# Patient Record
Sex: Female | Born: 1985 | Race: Black or African American | Hispanic: No | Marital: Single | Smoking: Former smoker
Health system: Southern US, Community
[De-identification: ages and names within clinical notes are randomized; demographics above are authoritative.]

## PROBLEM LIST (undated history)

## (undated) DIAGNOSIS — G894 Chronic pain syndrome: Secondary | ICD-10-CM

## (undated) DIAGNOSIS — M797 Fibromyalgia: Secondary | ICD-10-CM

## (undated) DIAGNOSIS — J45909 Unspecified asthma, uncomplicated: Secondary | ICD-10-CM

## (undated) DIAGNOSIS — K219 Gastro-esophageal reflux disease without esophagitis: Secondary | ICD-10-CM

## (undated) DIAGNOSIS — N939 Abnormal uterine and vaginal bleeding, unspecified: Secondary | ICD-10-CM

## (undated) DIAGNOSIS — F329 Major depressive disorder, single episode, unspecified: Secondary | ICD-10-CM

## (undated) DIAGNOSIS — F419 Anxiety disorder, unspecified: Secondary | ICD-10-CM

## (undated) DIAGNOSIS — Z8489 Family history of other specified conditions: Secondary | ICD-10-CM

## (undated) DIAGNOSIS — D649 Anemia, unspecified: Secondary | ICD-10-CM

## (undated) DIAGNOSIS — F32A Depression, unspecified: Secondary | ICD-10-CM

## (undated) DIAGNOSIS — M1990 Unspecified osteoarthritis, unspecified site: Secondary | ICD-10-CM

## (undated) HISTORY — DX: Chronic pain syndrome: G89.4

## (undated) HISTORY — DX: Fibromyalgia: M79.7

## (undated) HISTORY — PX: HERNIA REPAIR: SHX51

## (undated) HISTORY — DX: Abnormal uterine and vaginal bleeding, unspecified: N93.9

---
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
## 2015-10-11 ENCOUNTER — Emergency Department
Admission: EM | Admit: 2015-10-11 | Discharge: 2015-10-11 | Disposition: A | Discharge status: Home / Self Care | Payer: Medicaid Other | Attending: Emergency Medicine | Admitting: Emergency Medicine

## 2015-10-11 DIAGNOSIS — J029 Acute pharyngitis, unspecified: Secondary | ICD-10-CM | POA: Diagnosis not present

## 2015-10-11 DIAGNOSIS — B349 Viral infection, unspecified: Secondary | ICD-10-CM | POA: Insufficient documentation

## 2015-10-11 DIAGNOSIS — Z880 Allergy status to penicillin: Secondary | ICD-10-CM | POA: Diagnosis not present

## 2015-10-11 DIAGNOSIS — R509 Fever, unspecified: Secondary | ICD-10-CM | POA: Diagnosis present

## 2015-10-11 LAB — POCT RAPID STREP A: STREPTOCOCCUS, GROUP A SCREEN (DIRECT): NEGATIVE

## 2015-10-11 MED ORDER — LIDOCAINE VISCOUS 2 % MT SOLN: 5.0000 mL | Freq: Four times a day (QID) | OROMUCOSAL | Status: DC | PRN

## 2015-10-11 MED ORDER — PSEUDOEPH-BROMPHEN-DM 30-2-10 MG/5ML PO SYRP: 5.0000 mL | ORAL_SOLUTION | Freq: Four times a day (QID) | ORAL | Status: DC | PRN

## 2015-10-11 MED ORDER — DIPHENHYDRAMINE HCL 12.5 MG/5ML PO ELIX
25.0000 mg | ORAL_SOLUTION | Freq: Once | ORAL | Status: AC
  Administered 2015-10-11: 25 mg via ORAL
  Filled 2015-10-11: qty 10

## 2015-10-11 MED ORDER — OSELTAMIVIR PHOSPHATE 75 MG PO CAPS: 75.0000 mg | ORAL_CAPSULE | Freq: Two times a day (BID) | ORAL | Status: AC

## 2015-10-11 MED ORDER — LIDOCAINE VISCOUS 2 % MT SOLN
15.0000 mL | Freq: Once | OROMUCOSAL | Status: AC
  Administered 2015-10-11: 15 mL via OROMUCOSAL
  Filled 2015-10-11: qty 15

## 2015-10-11 MED ORDER — KETOROLAC TROMETHAMINE 60 MG/2ML IM SOLN
30.0000 mg | Freq: Once | INTRAMUSCULAR | Status: AC
  Administered 2015-10-11: 30 mg via INTRAMUSCULAR
  Filled 2015-10-11: qty 2

## 2015-10-11 NOTE — ED Notes (Signed)
Pt reports 3 days of fever, body aches, headache.

## 2015-10-11 NOTE — ED Provider Notes (Signed)
[facility] Emergency Department Provider Note  ____________________________________________  Time seen: Approximately 9:57 AM  I have reviewed the triage vital signs and the nursing notes.   HISTORY  Chief Complaint Fever and Generalized Body Aches    HPI Beth Floyd is a 30 y.o. female patient complaining of fever bodyaches sore throat which began 3 days ago. Patient state increased pain with swallowing.No palliative measures taken for this complaint. Patient has not taken a flu shot this season. Patient rates the pain discomfort as 8/10.   No past medical history on file.  There are no active problems to display for this patient.   No past surgical history on file.  Current Outpatient Rx  Name  Route  Sig  Dispense  Refill  . brompheniramine-pseudoephedrine-DM 30-2-10 MG/5ML syrup   Oral   Take 5 mLs by mouth 4 (four) times daily as needed. Mixed with 5 mL of viscous lidocaine for swish and swallow.   120 mL   0   . lidocaine (XYLOCAINE) 2 % solution   Mouth/Throat   Use as directed 5 mLs in the mouth or throat every 6 (six) hours as needed for mouth pain. Mixed with 5 mL of Bromfed DM. Swish and swallow.   100 mL   0   . oseltamivir (TAMIFLU) 75 MG capsule   Oral   Take 1 capsule (75 mg total) by mouth 2 (two) times daily.   20 capsule   0     Allergies Penicillins  No family history on file.  Social History Social History  Substance Use Topics  . Smoking status: Not on file  . Smokeless tobacco: Not on file  . Alcohol Use: Not on file    Review of Systems Constitutional: Fever and chills  Eyes: No visual changes. ENT: Sore throat Cardiovascular: Denies chest pain. Respiratory: Denies shortness of breath. Gastrointestinal: No abdominal pain.  No nausea, no vomiting.  No diarrhea.  No constipation. Genitourinary: Negative for dysuria. Musculoskeletal: Negative for back pain. Skin: Negative for rash. Neurological:  Negative for headaches, focal weakness or numbness. Allergic/Immunilogical: Penicillin  10-point ROS otherwise negative.  ____________________________________________   PHYSICAL EXAM:  VITAL SIGNS: ED Triage Vitals  Enc Vitals Group     BP 10/11/15 0824 119/74 mmHg     Pulse Rate 10/11/15 0824 90     Resp 10/11/15 0824 18     Temp 10/11/15 0824 98.3 F (36.8 C)     Temp Source 10/11/15 0824 Oral     SpO2 10/11/15 0824 100 %     Weight 10/11/15 0824 242 lb (109.77 kg)     Height 10/11/15 0824  (1.6 m)     Head Cir --      Peak Flow --      Pain Score 10/11/15 0825 8     Pain Loc --      Pain Edu? --      Excl. in GC? --     Constitutional: Alert and oriented. Appears malaise  Eyes: Conjunctivae are normal. PERRL. EOMI. Head: Atraumatic. Nose: No congestion/rhinnorhea. Mouth/Throat: Mucous membranes are moist.  Oropharynx erythematous. Edematous tonsils without exudate. Neck: No stridor. No cervical spine tenderness to palpation. Hematological/Lymphatic/Immunilogical: No cervical lymphadenopathy. Cardiovascular: Normal rate, regular rhythm. Grossly normal heart sounds.  Good peripheral circulation. Respiratory: Normal respiratory effort.  No retractions. Lungs CTAB. No productive cough Gastrointestinal: Soft and nontender. No distention. No abdominal bruits. No CVA tenderness. Musculoskeletal: No lower extremity tenderness nor edema.  No  joint effusions. Neurologic:  Normal speech and language. No gross focal neurologic deficits are appreciated. No gait instability. Skin:  Skin is warm, dry and intact. No rash noted. Psychiatric: Mood and affect are normal. Speech and behavior are normal.  ____________________________________________   LABS (all labs ordered are listed, but only abnormal results are displayed)  Labs Reviewed  CULTURE, GROUP A STREP [facility]  POCT RAPID STREP A    ____________________________________________  EKG   ____________________________________________  RADIOLOGY   ____________________________________________   PROCEDURES  Procedure(s) performed: None  Critical Care performed: No  ____________________________________________   INITIAL IMPRESSION / ASSESSMENT AND PLAN / ED COURSE  Pertinent labs & imaging results that were available during my care of the patient were reviewed by me and considered in my medical decision making (see chart for details).  Viral pharyngitis. Discussed negative rapid strep with patient advised cultures pending. Patient given discharge care instructions. Patient a prescription for Bromfed DM, viscous lidocaine, and Medrol Dosepak. Advised to follow-up with family doctor no improvement in 3-5 days. ____________________________________________   FINAL CLINICAL IMPRESSION(S) / ED DIAGNOSES  Final diagnoses:  Pharyngitis with viral syndrome      Joni ReiningRonald K Smith, PA-C 10/11/15 1012  Rockne MenghiniAnne-Caroline Norman, MD 10/11/15 1535

## 2015-10-11 NOTE — ED Notes (Signed)
Strep completed by tech, Pam 

## 2015-10-11 NOTE — ED Notes (Signed)
States she developed fever last pm of 102  This am body ahces with sore throat   Increased pain with swallowing

## 2015-10-11 NOTE — Discharge Instructions (Signed)

## 2015-10-11 NOTE — ED Notes (Signed)
Pt given warm blanket.

## 2015-10-13 LAB — CULTURE, GROUP A STREP (THRC)

## 2015-10-14 NOTE — Progress Notes (Signed)
30 y/o F with throat culture positive for GAS discharged from ED 10/11/15 without antibiotics. Dr. Pershing ProudSchaevitz authorized a Zpak TAD without RF. Attempted to call patient, but the phone number on file is not in service and no information in chart regarding PCP.   Luisa HartScott Keeton Kassebaum, PharmD Clinical Pharmacist

## 2016-11-21 ENCOUNTER — Emergency Department
Admission: EM | Admit: 2016-11-21 | Discharge: 2016-11-21 | Disposition: A | Discharge status: Home / Self Care | Payer: Medicaid Other | Attending: Emergency Medicine | Admitting: Emergency Medicine

## 2016-11-21 DIAGNOSIS — K029 Dental caries, unspecified: Secondary | ICD-10-CM | POA: Diagnosis not present

## 2016-11-21 DIAGNOSIS — K0889 Other specified disorders of teeth and supporting structures: Secondary | ICD-10-CM | POA: Diagnosis present

## 2016-11-21 MED ORDER — HYDROCODONE-ACETAMINOPHEN 5-325 MG PO TABS: 1.0000 | ORAL_TABLET | Freq: Four times a day (QID) | ORAL | 0 refills | Status: AC | PRN

## 2016-11-21 MED ORDER — CLINDAMYCIN HCL 300 MG PO CAPS: 300.0000 mg | ORAL_CAPSULE | Freq: Three times a day (TID) | ORAL | 0 refills | Status: AC

## 2016-11-21 MED ORDER — KETOROLAC TROMETHAMINE 60 MG/2ML IM SOLN
30.0000 mg | Freq: Once | INTRAMUSCULAR | Status: AC
  Administered 2016-11-21: 30 mg via INTRAMUSCULAR

## 2016-11-21 MED ORDER — KETOROLAC TROMETHAMINE 30 MG/ML IJ SOLN
INTRAMUSCULAR | Status: AC
  Filled 2016-11-21: qty 1

## 2016-11-21 NOTE — ED Triage Notes (Signed)
Patient reports dental pain since yesterday.  States started left lower but now feels like her whole mouth.

## 2016-11-21 NOTE — ED Provider Notes (Signed)
[facility] Emergency Department Provider Note  ____________________________________________  Time seen: Approximately 8:52 PM  I have reviewed the triage vital signs and the nursing notes.   HISTORY  Chief Complaint Dental Pain    HPI Beth Floyd is a 31 y.o. female that presents the emergency department with low right dental pain for one day. Patient has not seen a dentist in 5 months. Dentist pulled a lower left tooth but did not pull her lower right tooth. Last time she was here, she received a shot in her hip, which helped. She is allergic to penicillins. She denies fever, difficulty opening and closing mouth, drooling, drainage from mouth, swelling, shortness of breath, chest pain, nausea, vomiting, abdominal pain.   No past medical history on file.  There are no active problems to display for this patient.   No past surgical history on file.  Prior to Admission medications   Medication Sig Start Date End Date Taking? Authorizing Provider  brompheniramine-pseudoephedrine-DM 30-2-10 MG/5ML syrup Take 5 mLs by mouth 4 (four) times daily as needed. Mixed with 5 mL of viscous lidocaine for swish and swallow. 10/11/15   Joni Reining, PA-C  clindamycin (CLEOCIN) 300 MG capsule Take 1 capsule (300 mg total) by mouth 3 (three) times daily. 11/21/16 12/01/16  Enid Derry, PA-C  HYDROcodone-acetaminophen (NORCO/VICODIN) 5-325 MG tablet Take 1 tablet by mouth every 6 (six) hours as needed for moderate pain. 11/21/16 11/23/16  Enid Derry, PA-C  lidocaine (XYLOCAINE) 2 % solution Use as directed 5 mLs in the mouth or throat every 6 (six) hours as needed for mouth pain. Mixed with 5 mL of Bromfed DM. Swish and swallow. 10/11/15   Joni Reining, PA-C    Allergies Penicillins  No family history on file.  Social History Social History  Substance Use Topics  . Smoking status: Not on file  . Smokeless tobacco: Not on file  . Alcohol use Not on file      Review of Systems  Constitutional: No fever/chills ENT: No upper respiratory complaints. Cardiovascular: No chest pain. Respiratory: No cough. No SOB. Gastrointestinal: No abdominal pain.  No nausea, no vomiting.  Musculoskeletal: Negative for musculoskeletal pain. Skin: Negative for rash, abrasions, lacerations, ecchymosis. Neurological: Negative for headaches, numbness or tingling   ____________________________________________   PHYSICAL EXAM:  VITAL SIGNS: ED Triage Vitals  Enc Vitals Group     BP 11/21/16 1940 (!) 149/97     Pulse Rate 11/21/16 1940 69     Resp 11/21/16 1940 18     Temp 11/21/16 1940 99.3 F (37.4 C)     Temp Source 11/21/16 1940 Oral     SpO2 11/21/16 1940 97 %     Weight 11/21/16 1941 220 lb (99.8 kg)     Height 11/21/16 1941  (1.6 m)     Head Circumference --      Peak Flow --      Pain Score 11/21/16 1939 10     Pain Loc --      Pain Edu? --      Excl. in GC? --      Constitutional: Alert and oriented. Well appearing and in no acute distress. Eyes: Conjunctivae are normal. PERRL. EOMI. Head: Atraumatic. ENT:      Ears:      Nose: No congestion/rhinnorhea.      Mouth/Throat: Mucous membranes are moist. Multiple dental caries. Poor dentition. Tenderness to palpation over lower right molars. No drainage present. No TMJ pain. No  swelling. Neck: No stridor. Cardiovascular: Normal rate, regular rhythm.  Good peripheral circulation. Respiratory: Normal respiratory effort without tachypnea or retractions. Lungs CTAB. Good air entry to the bases with no decreased or absent breath sounds. Musculoskeletal: Full range of motion to all extremities. No gross deformities appreciated. Neurologic:  Normal speech and language. No gross focal neurologic deficits are appreciated.  Skin:  Skin is warm, dry and intact. No rash noted.   ____________________________________________   LABS (all labs ordered are listed, but only abnormal results are  displayed)  Labs Reviewed - No data to display ____________________________________________  EKG   ____________________________________________  RADIOLOGY  No results found.  ____________________________________________    PROCEDURES  Procedure(s) performed:    Procedures    Medications  ketorolac (TORADOL) injection 30 mg (30 mg Intramuscular Given 11/21/16 2146)     ____________________________________________   INITIAL IMPRESSION / ASSESSMENT AND PLAN / ED COURSE  Pertinent labs & imaging results that were available during my care of the patient were reviewed by me and considered in my medical decision making (see chart for details).  Review of the [place] CSRS was performed in accordance of the [facility] prior to dispensing any controlled drugs.     Patient's diagnosis is consistent with dental caries. Vital signs and exam are reassuring. Patient will be discharged home with prescriptions for clindamycin and a short course of Vicodin. Patient is to follow up with dentist as directed. Patient is given ED precautions to return to the ED for any worsening or new symptoms.     ____________________________________________  FINAL CLINICAL IMPRESSION(S) / ED DIAGNOSES  Final diagnoses:  Dental caries      NEW MEDICATIONS STARTED DURING THIS VISIT:  Discharge Medication List as of 11/21/2016  9:32 PM    START taking these medications   Details  clindamycin (CLEOCIN) 300 MG capsule Take 1 capsule (300 mg total) by mouth 3 (three) times daily., Starting Tue 11/21/2016, Until Fri 12/01/2016, Print    HYDROcodone-acetaminophen (NORCO/VICODIN) 5-325 MG tablet Take 1 tablet by mouth every 6 (six) hours as needed for moderate pain., Starting Tue 11/21/2016, Until Thu 11/23/2016, Print            This chart was dictated using voice recognition software/Dragon. Despite best efforts to proofread, errors can occur which can change the meaning. Any change was purely  unintentional.    Enid Derry, PA-C 11/22/16 0017    Phineas Semen, MD 11/22/16 1538

## 2016-11-21 NOTE — Discharge Instructions (Signed)
OPTIONS FOR DENTAL FOLLOW UP CARE ° °[place] Department of Health and Human Services - Local Safety Net Dental Clinics °http://www.ncdhhs.gov/dph/oralhealth/services/safetynetclinics.htm °  °[facility] (336-562-3123) ° °[facility] [place] ([phone]) ° °[facility] ([phone] ext [phone]) ° °[facility] ([phone]) ° °[facility] ([phone]) °This clinic caters to the indigent population and is on a lottery system. °Location: °[facility], [address], [place] °Clinic Hours: °Wednesdays from 6pm - 9pm, patients seen by a lottery system. °For dates, call or go to www.med.[facility].edu/[facility]/patients/Dental-[facility] °Services: °Cleanings, fillings and simple extractions. °Payment Options: °DENTAL WORK IS FREE OF CHARGE. Bring proof of income or support. °Best way to get seen: °Arrive at 5:15 pm - this is a lottery, NOT first come/first serve, so arriving earlier will not increase your chances of being seen. °  °  °[facility] °[phone] °Select option 1 for emergencies °  °Location: °[facility], [address], [place] °Clinic Hours: °No walk-ins accepted - call the day before to schedule an appointment. °Check in times are 9:30 am and 1:30 pm. °Services: °Simple extractions, temporary fillings, pulpectomy/pulp debridement, uncomplicated abscess drainage. °Payment Options: °PAYMENT IS DUE AT THE TIME OF SERVICE.  Fee is usually $100-200, additional surgical procedures (e.g. abscess drainage) may be extra. °Cash, checks, Visa/MasterCard accepted.  Can file Medicaid if patient is covered for dental - patient should call case worker to check. °No discount for [facility] patients. °Best way to get seen: °MUST call the day before and get onto the schedule. Can usually be seen the next 1-2 days. No walk-ins accepted. °  °  °[facility] °[phone] °   °Location: °[facility], [address], [place] °Clinic Hours: °M, W, Th, F 8am or 1:30pm, Tues 9a or 1:30 - first come/first served. °Services: °Simple extractions, temporary fillings, uncomplicated abscess drainage.  You do not need to be an [place] resident. °Payment Options: °PAYMENT IS DUE AT THE TIME OF SERVICE. °Dental insurance, otherwise sliding scale - bring proof of income or support. °Depending on income and treatment needed, cost is usually $50-200. °Best way to get seen: °Arrive early as it is first come/first served. °  °  °[facility] °[phone] °  °Location: °[address] °Clinic Hours: °Mon-Thu 8a-5p °Services: °Most basic dental services including extractions and fillings. °Payment Options: °PAYMENT IS DUE AT THE TIME OF SERVICE. °Sliding scale, up to 50% off - bring proof if income or support. °Medicaid with dental option accepted. °Best way to get seen: °Call to schedule an appointment, can usually be seen within 2 weeks OR they will try to see walk-ins - show up at 8a or 2p (you may have to wait). °  °  °[facility] °919-245-2435 °[place] RESIDENTS ONLY °  °Location: °[facility], [address], [place], [place] [address] °Clinic Hours: By appointment only. °Monday - Thursday 8am-5pm, Friday 8am-12pm °Services: Cleanings, fillings, extractions. °Payment Options: °PAYMENT IS DUE AT THE TIME OF SERVICE. °Cash, Visa or MasterCard. Sliding scale - $30 minimum per service. °Best way to get seen: °Come in to office, complete packet and make an appointment - need proof of income °or support monies for each household member and proof of [place] residence. °Usually takes about a month to get in. °  °  °[facility] °[phone] °  °Location: °[address]   [place] °Clinic Hours: Walk-in Urgent Care Dental Services are offered Monday-Friday  mornings only. °The numbers of emergencies accepted daily is limited to the number of °providers available. °Maximum 15 - Mondays, Wednesdays & Thursdays °Maximum 10 - Tuesdays & Fridays °Services: °You do not need to be a [place] resident to be seen for a dental emergency. °Emergencies are defined as pain, swelling, abnormal bleeding, or dental trauma. Walkins will receive x-rays if needed. °NOTE: Dental cleaning is not an emergency. °Payment Options: °PAYMENT IS DUE AT THE TIME OF SERVICE. °Minimum co-pay is $40.00 for uninsured patients. °Minimum co-pay is $3.00 for Medicaid with dental coverage. °Dental Insurance is accepted and must be presented at time of visit. °Medicare does not cover dental. °Forms of payment: Cash, credit card, checks. °Best way to get seen: °If not previously registered with the clinic, walk-in dental registration begins at 7:15 am and is on a first come/first serve basis. °If previously registered with the clinic, call to make an appointment. °  °  °[facility] °[phone] °[place] RESIDENTS ONLY °  °Location: °[address], [place], [place] °Clinic Hours: °Mon-Thu 10a-2p °Services: Extractions only! °Payment Options: °FREE (donations accepted) - bring proof of income or support °Best way to get seen: °Call and schedule an appointment OR come at 8am on the 1st Monday of every month (except for holidays) when it is first come/first served. °  °  °[facility] °[phone] °  °Location: °[address], [place] °Clinic Hours: °Friday mornings °Services, Payment Options, Best way to get seen: °Call for info °

## 2017-12-28 ENCOUNTER — Other Ambulatory Visit: Payer: Self-pay | Admitting: Orthopedic Surgery

## 2017-12-28 DIAGNOSIS — G8929 Other chronic pain: Secondary | ICD-10-CM

## 2017-12-28 DIAGNOSIS — M47816 Spondylosis without myelopathy or radiculopathy, lumbar region: Secondary | ICD-10-CM

## 2017-12-28 DIAGNOSIS — M545 Low back pain: Principal | ICD-10-CM

## 2018-01-16 ENCOUNTER — Ambulatory Visit: Payer: Self-pay

## 2018-05-08 ENCOUNTER — Other Ambulatory Visit: Payer: Self-pay | Admitting: Orthopedic Surgery

## 2018-05-08 DIAGNOSIS — M5442 Lumbago with sciatica, left side: Secondary | ICD-10-CM

## 2018-05-08 DIAGNOSIS — M545 Low back pain: Principal | ICD-10-CM

## 2018-05-08 DIAGNOSIS — G8929 Other chronic pain: Secondary | ICD-10-CM

## 2018-05-08 DIAGNOSIS — M5441 Lumbago with sciatica, right side: Secondary | ICD-10-CM

## 2018-05-28 ENCOUNTER — Ambulatory Visit
Admission: RE | Admit: 2018-05-28 | Discharge: 2018-05-28 | Disposition: A | Discharge status: Home / Self Care | Payer: Medicaid Other | Source: Ambulatory Visit | Attending: Orthopedic Surgery | Admitting: Orthopedic Surgery

## 2018-05-28 ENCOUNTER — Encounter: Payer: Self-pay | Admitting: Radiology

## 2018-05-28 DIAGNOSIS — M5441 Lumbago with sciatica, right side: Secondary | ICD-10-CM | POA: Diagnosis present

## 2018-05-28 DIAGNOSIS — M5442 Lumbago with sciatica, left side: Secondary | ICD-10-CM | POA: Insufficient documentation

## 2018-05-28 DIAGNOSIS — G8929 Other chronic pain: Secondary | ICD-10-CM | POA: Diagnosis not present

## 2018-05-28 DIAGNOSIS — M545 Low back pain, unspecified: Secondary | ICD-10-CM

## 2019-07-16 ENCOUNTER — Ambulatory Visit
Admission: EM | Admit: 2019-07-16 | Discharge: 2019-07-16 | Disposition: A | Discharge status: Home / Self Care | Payer: Medicaid Other | Attending: Family Medicine | Admitting: Family Medicine

## 2019-07-16 ENCOUNTER — Other Ambulatory Visit: Payer: Self-pay

## 2019-07-16 DIAGNOSIS — R42 Dizziness and giddiness: Secondary | ICD-10-CM

## 2019-07-16 DIAGNOSIS — R519 Headache, unspecified: Secondary | ICD-10-CM | POA: Diagnosis not present

## 2019-07-16 MED ORDER — BUTALBITAL-APAP-CAFFEINE 50-325-40 MG PO TABS: 1.0000 | ORAL_TABLET | Freq: Four times a day (QID) | ORAL | 0 refills | Status: DC | PRN

## 2019-07-16 MED ORDER — MECLIZINE HCL 25 MG PO TABS: 25.0000 mg | ORAL_TABLET | Freq: Three times a day (TID) | ORAL | 0 refills | Status: DC | PRN

## 2019-07-16 NOTE — Discharge Instructions (Signed)
Medication as needed.  See PCP. Discuss referral to neurology.  Take care  Dr. Lacinda Axon

## 2019-07-16 NOTE — ED Provider Notes (Signed)
[facility]    CSN: [id number] Arrival date & time: 07/16/19  1603   History   Chief Complaint Chief Complaint  Patient presents with  . Headache   HPI 33 year old female presents with dizziness and headache.  Patient reports that she has had ongoing dizziness over the past year.  She reports frequent headaches over the past month.  Patient reports that she currently has a moderate headache, 6/10 in severity.  She states that the site of the headache tends to vary.  She has intermittent photophobia.  Patient reports that she has had some floaters as well.  Dizziness is intermittent.  No medications or interventions tried.  No known exacerbating or relieving factors.  She has not not discussed this with her primary.  No other complaints or concerns at this time.  PMH, Surgical Hx, Family Hx, Social History reviewed and updated as below.  PMH: Morbid obesity, depression, chronic back pain, history of joint pain at multiple sites, myopia  Past Surgical History:  Procedure Laterality Date  . CESAREAN SECTION    . HERNIA REPAIR    Tubal ligation  OB History   No obstetric history on file.      Home Medications    Prior to Admission medications   Medication Sig Start Date End Date Taking? Authorizing Provider  butalbital-acetaminophen-caffeine (FIORICET) 50-325-40 MG tablet Take 1 tablet by mouth every 6 (six) hours as needed for headache. 07/16/19 07/15/20  Coral Spikes, DO  meclizine (ANTIVERT) 25 MG tablet Take 1 tablet (25 mg total) by mouth 3 (three) times daily as needed for dizziness. 07/16/19   Coral Spikes, DO    Family History Family History  Problem Relation Age of Onset  . Hyperlipidemia Mother   . Rheum arthritis Mother   . Celiac disease Mother   . Hypertension Father     Social History Social History   Tobacco Use  . Smoking status: Never Smoker  . Smokeless tobacco: Never Used  Substance Use Topics  . Alcohol use: Never    Frequency:  Never  . Drug use: Never     Allergies   Penicillins   Review of Systems Review of Systems  Constitutional: Negative.   Neurological: Positive for dizziness and headaches.   Physical Exam Triage Vital Signs ED Triage Vitals  Enc Vitals Group     BP 07/16/19 1638 (!) 138/97     Pulse Rate 07/16/19 1638 66     Resp 07/16/19 1638 16     Temp 07/16/19 1638 98.1 F (36.7 C)     Temp Source 07/16/19 1638 Oral     SpO2 07/16/19 1638 100 %     Weight 07/16/19 1635 268 lb (121.6 kg)     Height 07/16/19 1635 5\' 3"  (1.6 m)     Head Circumference --      Peak Flow --      Pain Score 07/16/19 1634 6     Pain Loc --      Pain Edu? --      Excl. in [place]? --    Updated Vital Signs BP (!) 138/97 (BP Location: Right Arm)   Pulse 66   Temp 98.1 F (36.7 C) (Oral)   Resp 16   Ht 5\' 3"  (1.6 m)   Wt 121.6 kg   SpO2 100%   BMI 47.47 kg/m   Visual Acuity Right Eye Distance:   Left Eye Distance:   Bilateral Distance:    Right Eye Near:  Left Eye Near:    Bilateral Near:     Physical Exam Vitals signs and nursing note reviewed.  Constitutional:      General: She is not in acute distress.    Appearance: Normal appearance. She is obese. She is not ill-appearing.  HENT:     Head: Normocephalic and atraumatic.     Nose: Nose normal.  Eyes:     General:        Right eye: No discharge.        Left eye: No discharge.     Extraocular Movements: Extraocular movements intact.     Conjunctiva/sclera: Conjunctivae normal.     Pupils: Pupils are equal, round, and reactive to light.  Cardiovascular:     Rate and Rhythm: Normal rate and regular rhythm.     Heart sounds: No murmur.  Pulmonary:     Effort: Pulmonary effort is normal.     Breath sounds: Normal breath sounds. No wheezing, rhonchi or rales.  Neurological:     General: No focal deficit present.     Mental Status: She is alert and oriented to person, place, and time.  Psychiatric:        Mood and Affect: Mood normal.         Behavior: Behavior normal.    UC Treatments / Results  Labs (all labs ordered are listed, but only abnormal results are displayed) Labs Reviewed - No data to display  EKG   Radiology No results found.  Procedures Procedures (including critical care time)  Medications Ordered in UC Medications - No data to display  Initial Impression / Assessment and Plan / UC Course  I have reviewed the triage vital signs and the nursing notes.  Pertinent labs & imaging results that were available during my care of the patient were reviewed by me and considered in my medical decision making (see chart for details).    33 year old female presents with ongoing dizziness and ongoing headaches.  She is well-appearing.  Exam unremarkable.  Fioricet as needed.  Meclizine as needed.  Advise follow-up with PCP to discuss referral to neurology.  Final Clinical Impressions(s) / UC Diagnoses   Final diagnoses:  Headache disorder  Dizziness     Discharge Instructions     Medication as needed.  See PCP. Discuss referral to neurology.  Take care  Dr. Adriana Simas    ED Prescriptions    Medication Sig Dispense Auth. Provider   butalbital-acetaminophen-caffeine (FIORICET) 50-325-40 MG tablet Take 1 tablet by mouth every 6 (six) hours as needed for headache. 20 tablet Sonjia Wilcoxson G, DO   meclizine (ANTIVERT) 25 MG tablet Take 1 tablet (25 mg total) by mouth 3 (three) times daily as needed for dizziness. 30 tablet Tommie Sams, DO     PDMP not reviewed this encounter.   Tommie Sams, [place] 07/16/19 1956

## 2019-07-16 NOTE — ED Triage Notes (Signed)
Patient complains of dizziness and headache that started last night. States that she has felt like she needs to vomit but can't. States that she has multiple episodes of this in a month almost daily.

## 2019-09-04 ENCOUNTER — Other Ambulatory Visit: Payer: Self-pay | Admitting: Certified Nurse Midwife

## 2019-09-04 DIAGNOSIS — N939 Abnormal uterine and vaginal bleeding, unspecified: Secondary | ICD-10-CM

## 2019-09-05 ENCOUNTER — Other Ambulatory Visit: Payer: Self-pay

## 2019-09-05 ENCOUNTER — Ambulatory Visit
Admission: RE | Admit: 2019-09-05 | Discharge: 2019-09-05 | Disposition: A | Discharge status: Home / Self Care | Payer: Medicaid Other | Source: Ambulatory Visit | Attending: Certified Nurse Midwife | Admitting: Certified Nurse Midwife

## 2019-09-05 DIAGNOSIS — N939 Abnormal uterine and vaginal bleeding, unspecified: Secondary | ICD-10-CM | POA: Diagnosis not present

## 2019-09-10 ENCOUNTER — Encounter
Admission: RE | Admit: 2019-09-10 | Discharge: 2019-09-10 | Disposition: A | Discharge status: Home / Self Care | Payer: Medicaid Other | Source: Ambulatory Visit | Attending: Obstetrics & Gynecology | Admitting: Obstetrics & Gynecology

## 2019-09-10 ENCOUNTER — Other Ambulatory Visit: Payer: Self-pay

## 2019-09-10 DIAGNOSIS — Z20822 Contact with and (suspected) exposure to covid-19: Secondary | ICD-10-CM | POA: Insufficient documentation

## 2019-09-10 DIAGNOSIS — Z01812 Encounter for preprocedural laboratory examination: Secondary | ICD-10-CM | POA: Insufficient documentation

## 2019-09-10 HISTORY — DX: Unspecified asthma, uncomplicated: J45.909

## 2019-09-10 HISTORY — DX: Anxiety disorder, unspecified: F41.9

## 2019-09-10 HISTORY — DX: Depression, unspecified: F32.A

## 2019-09-10 HISTORY — DX: Anemia, unspecified: D64.9

## 2019-09-10 HISTORY — DX: Family history of other specified conditions: Z84.89

## 2019-09-10 HISTORY — DX: Unspecified osteoarthritis, unspecified site: M19.90

## 2019-09-10 HISTORY — DX: Gastro-esophageal reflux disease without esophagitis: K21.9

## 2019-09-10 NOTE — Patient Instructions (Signed)
Your procedure is scheduled on: Monday September 15, 2019 Report to Day Surgery on the 2nd floor of [facility]. To find out your arrival time, please call [phone] between 1PM - 3PM on: September 12, 2019   REMEMBER: Instructions that are not followed completely may result in serious medical risk, up to and including death; or upon the discretion of your surgeon and anesthesiologist your surgery may need to be rescheduled.  Do not eat food after midnight the night before surgery.  No gum chewing, lozengers or hard candies.  You may however, drink CLEAR liquids up to 2 hours before you are scheduled to arrive for your surgery. Do not drink anything within 2 hours of the start of your surgery.  Clear liquids include: - water  - apple juice without pulp -clear  gatorade - black coffee or tea (Do NOT add milk or creamers to the coffee or tea) Do NOT drink anything that is not on this list.  Type 1 and Type 2 diabetics should only drink water.  ENSURE PRE-SURGERY CARBOHYDRATE DRINK IF IN BAG:  Complete drinking drink 2 hours before arriving for surgery.   No Alcohol for 24 hours before or after surgery.  No Smoking including e-cigarettes for 24 hours prior to surgery.  No chewable tobacco products for at least 6 hours prior to surgery.  No nicotine patches on the day of surgery.  On the morning of surgery brush your teeth with toothpaste and water, you may rinse your mouth with mouthwash if you wish. Do not swallow any toothpaste or mouthwash.  Notify your doctor if there is any change in your medical condition (cold, fever, infection).  Do not wear jewelry, make-up, hairpins, clips or nail polish.  Do not wear lotions, powders, or perfumes.   Do not shave 48 hours prior to surgery.   Contacts and dentures may not be worn into surgery.  Do not bring valuables to the hospital, including drivers license, insurance or credit cards.  [facility] is not responsible for any  belongings or valuables.   TAKE THESE MEDICATIONS THE MORNING OF SURGERY: PROVERA  TAKE SHOWER MORNING OF SURGERY  Use inhalers on the day of surgery   Stop Anti-inflammatories (NSAIDS) such as Advil, Aleve, Ibuprofen, Motrin, Naproxen, Naprosyn and ASPIRIN OR Aspirin based products such as Excedrin, Goodys Powder, BC Powder. (May take Tylenol or Acetaminophen if needed.)  Stop ANY OVER THE COUNTER supplements until after surgery. (May continue Vitamin D, Vitamin B, and multivitamin.)  Wear comfortable clothing (specific to your surgery type) to the hospital.  If you are being discharged the day of surgery, you will not be allowed to drive home. You will need a responsible adult to drive you home and stay with you that night.   If you are taking public transportation, you will need to have a responsible adult with you. Please confirm with your physician that it is acceptable to use public transportation.   Please call [phone] if you have any questions about these instructions.

## 2019-09-11 ENCOUNTER — Other Ambulatory Visit: Payer: Medicaid Other

## 2019-09-11 ENCOUNTER — Other Ambulatory Visit
Admission: RE | Admit: 2019-09-11 | Discharge: 2019-09-11 | Disposition: A | Discharge status: Home / Self Care | Payer: Medicaid Other | Source: Ambulatory Visit | Attending: Obstetrics & Gynecology | Admitting: Obstetrics & Gynecology

## 2019-09-11 DIAGNOSIS — Z20822 Contact with and (suspected) exposure to covid-19: Secondary | ICD-10-CM | POA: Diagnosis not present

## 2019-09-11 DIAGNOSIS — Z01812 Encounter for preprocedural laboratory examination: Secondary | ICD-10-CM | POA: Diagnosis not present

## 2019-09-12 LAB — SARS CORONAVIRUS 2 (TAT 6-24 HRS): SARS Coronavirus 2: NEGATIVE

## 2019-09-15 ENCOUNTER — Ambulatory Visit
Admission: RE | Admit: 2019-09-15 | Discharge: 2019-09-15 | Disposition: A | Discharge status: Home / Self Care | Payer: Medicaid Other | Attending: Obstetrics & Gynecology | Admitting: Obstetrics & Gynecology

## 2019-09-15 ENCOUNTER — Ambulatory Visit: Payer: Medicaid Other | Admitting: Anesthesiology

## 2019-09-15 ENCOUNTER — Other Ambulatory Visit: Payer: Self-pay

## 2019-09-15 ENCOUNTER — Encounter
Admission: RE | Disposition: A | Discharge status: Home / Self Care | Payer: Self-pay | Source: Home / Self Care | Attending: Obstetrics & Gynecology

## 2019-09-15 DIAGNOSIS — J45909 Unspecified asthma, uncomplicated: Secondary | ICD-10-CM | POA: Insufficient documentation

## 2019-09-15 DIAGNOSIS — R9389 Abnormal findings on diagnostic imaging of other specified body structures: Secondary | ICD-10-CM | POA: Insufficient documentation

## 2019-09-15 DIAGNOSIS — Z79899 Other long term (current) drug therapy: Secondary | ICD-10-CM | POA: Diagnosis not present

## 2019-09-15 DIAGNOSIS — Z793 Long term (current) use of hormonal contraceptives: Secondary | ICD-10-CM | POA: Diagnosis not present

## 2019-09-15 DIAGNOSIS — M1990 Unspecified osteoarthritis, unspecified site: Secondary | ICD-10-CM | POA: Diagnosis not present

## 2019-09-15 DIAGNOSIS — N939 Abnormal uterine and vaginal bleeding, unspecified: Secondary | ICD-10-CM | POA: Diagnosis not present

## 2019-09-15 DIAGNOSIS — F329 Major depressive disorder, single episode, unspecified: Secondary | ICD-10-CM | POA: Diagnosis not present

## 2019-09-15 DIAGNOSIS — F419 Anxiety disorder, unspecified: Secondary | ICD-10-CM | POA: Diagnosis not present

## 2019-09-15 DIAGNOSIS — Z87891 Personal history of nicotine dependence: Secondary | ICD-10-CM | POA: Insufficient documentation

## 2019-09-15 HISTORY — PX: HYSTEROSCOPY WITH D & C: SHX1775

## 2019-09-15 LAB — POCT PREGNANCY, URINE: Preg Test, Ur: NEGATIVE

## 2019-09-15 SURGERY — DILATATION AND CURETTAGE /HYSTEROSCOPY
Anesthesia: General

## 2019-09-15 MED ORDER — METHYLERGONOVINE MALEATE 0.2 MG/ML IJ SOLN
INTRAMUSCULAR | Status: DC | PRN
  Administered 2019-09-15: .2 mg via INTRAMUSCULAR

## 2019-09-15 MED ORDER — GLYCOPYRROLATE 0.2 MG/ML IJ SOLN
INTRAMUSCULAR | Status: DC | PRN
  Administered 2019-09-15: .2 mg via INTRAVENOUS

## 2019-09-15 MED ORDER — ACETAMINOPHEN 500 MG PO TABS
ORAL_TABLET | ORAL | Status: AC
  Filled 2019-09-15: qty 2

## 2019-09-15 MED ORDER — KETOROLAC TROMETHAMINE 15 MG/ML IJ SOLN
INTRAMUSCULAR | Status: AC
  Filled 2019-09-15: qty 1

## 2019-09-15 MED ORDER — LACTATED RINGERS IV SOLN: INTRAVENOUS | Status: DC

## 2019-09-15 MED ORDER — FENTANYL CITRATE (PF) 100 MCG/2ML IJ SOLN
INTRAMUSCULAR | Status: DC | PRN
  Administered 2019-09-15: 50 ug via INTRAVENOUS
  Administered 2019-09-15 (×2): 25 ug via INTRAVENOUS

## 2019-09-15 MED ORDER — PROMETHAZINE HCL 25 MG/ML IJ SOLN: 6.2500 mg | INTRAMUSCULAR | Status: DC | PRN

## 2019-09-15 MED ORDER — MIDAZOLAM HCL 2 MG/2ML IJ SOLN
INTRAMUSCULAR | Status: DC | PRN
  Administered 2019-09-15: 2 mg via INTRAVENOUS

## 2019-09-15 MED ORDER — DEXAMETHASONE SODIUM PHOSPHATE 10 MG/ML IJ SOLN
INTRAMUSCULAR | Status: DC | PRN
  Administered 2019-09-15: 10 mg via INTRAVENOUS

## 2019-09-15 MED ORDER — SILVER NITRATE-POT NITRATE 75-25 % EX MISC
CUTANEOUS | Status: DC | PRN
  Administered 2019-09-15: 2 via TOPICAL

## 2019-09-15 MED ORDER — OXYCODONE HCL 5 MG PO TABS: 5.0000 mg | ORAL_TABLET | Freq: Once | ORAL | Status: DC | PRN

## 2019-09-15 MED ORDER — FAMOTIDINE 20 MG PO TABS
ORAL_TABLET | ORAL | Status: AC
  Filled 2019-09-15: qty 1

## 2019-09-15 MED ORDER — KETOROLAC TROMETHAMINE 15 MG/ML IJ SOLN
15.0000 mg | INTRAMUSCULAR | Status: AC
  Administered 2019-09-15: 15 mg via INTRAVENOUS

## 2019-09-15 MED ORDER — KETOROLAC TROMETHAMINE 15 MG/ML IJ SOLN
INTRAMUSCULAR | Status: AC
  Administered 2019-09-15: 15 mg
  Filled 2019-09-15: qty 1

## 2019-09-15 MED ORDER — PROPOFOL 10 MG/ML IV BOLUS
INTRAVENOUS | Status: DC | PRN
  Administered 2019-09-15: 200 mg via INTRAVENOUS

## 2019-09-15 MED ORDER — PROPOFOL 10 MG/ML IV BOLUS
INTRAVENOUS | Status: AC
  Filled 2019-09-15: qty 20

## 2019-09-15 MED ORDER — METHYLERGONOVINE MALEATE 0.2 MG PO TABS: 0.2000 mg | ORAL_TABLET | Freq: Three times a day (TID) | ORAL | 0 refills | Status: AC

## 2019-09-15 MED ORDER — MEPERIDINE HCL 50 MG/ML IJ SOLN: 6.2500 mg | INTRAMUSCULAR | Status: DC | PRN

## 2019-09-15 MED ORDER — METHYLERGONOVINE MALEATE 0.2 MG/ML IJ SOLN
INTRAMUSCULAR | Status: AC
  Filled 2019-09-15: qty 1

## 2019-09-15 MED ORDER — FAMOTIDINE 20 MG PO TABS
20.0000 mg | ORAL_TABLET | Freq: Once | ORAL | Status: AC
  Administered 2019-09-15: 20 mg via ORAL

## 2019-09-15 MED ORDER — FENTANYL CITRATE (PF) 100 MCG/2ML IJ SOLN: 25.0000 ug | INTRAMUSCULAR | Status: DC | PRN

## 2019-09-15 MED ORDER — ONDANSETRON HCL 4 MG/2ML IJ SOLN
INTRAMUSCULAR | Status: DC | PRN
  Administered 2019-09-15: 4 mg via INTRAVENOUS

## 2019-09-15 MED ORDER — ONDANSETRON HCL 4 MG/2ML IJ SOLN
INTRAMUSCULAR | Status: AC
  Filled 2019-09-15: qty 2

## 2019-09-15 MED ORDER — ACETAMINOPHEN 500 MG PO TABS
1000.0000 mg | ORAL_TABLET | ORAL | Status: AC
  Administered 2019-09-15: 1000 mg via ORAL

## 2019-09-15 MED ORDER — MIDAZOLAM HCL 2 MG/2ML IJ SOLN
INTRAMUSCULAR | Status: AC
  Filled 2019-09-15: qty 2

## 2019-09-15 MED ORDER — LORAZEPAM 2 MG/ML IJ SOLN: 0.5000 mg | Freq: Once | INTRAMUSCULAR | Status: DC

## 2019-09-15 MED ORDER — DEXAMETHASONE SODIUM PHOSPHATE 10 MG/ML IJ SOLN
INTRAMUSCULAR | Status: AC
  Filled 2019-09-15: qty 1

## 2019-09-15 MED ORDER — OXYCODONE HCL 5 MG/5ML PO SOLN: 5.0000 mg | Freq: Once | ORAL | Status: DC | PRN

## 2019-09-15 MED ORDER — KETOROLAC TROMETHAMINE 15 MG/ML IJ SOLN: 15.0000 mg | Freq: Once | INTRAMUSCULAR | Status: DC

## 2019-09-15 MED ORDER — LIDOCAINE HCL (PF) 2 % IJ SOLN
INTRAMUSCULAR | Status: AC
  Filled 2019-09-15: qty 10

## 2019-09-15 MED ORDER — LIDOCAINE HCL (CARDIAC) PF 100 MG/5ML IV SOSY
PREFILLED_SYRINGE | INTRAVENOUS | Status: DC | PRN
  Administered 2019-09-15: 60 mg via INTRAVENOUS

## 2019-09-15 MED ORDER — FENTANYL CITRATE (PF) 100 MCG/2ML IJ SOLN
INTRAMUSCULAR | Status: AC
  Filled 2019-09-15: qty 2

## 2019-09-15 SURGICAL SUPPLY — 19 items
COVER WAND RF STERILE (DRAPES) ×3 IMPLANT
DEVICE MYOSURE LITE (MISCELLANEOUS) IMPLANT
DEVICE MYOSURE REACH (MISCELLANEOUS) IMPLANT
ELECT REM PT RETURN 9FT ADLT (ELECTROSURGICAL) ×3
ELECTRODE REM PT RTRN 9FT ADLT (ELECTROSURGICAL) ×1 IMPLANT
GLOVE PI ORTHOPRO 6.5 (GLOVE) ×2
GLOVE PI ORTHOPRO STRL 6.5 (GLOVE) ×1 IMPLANT
GLOVE SURG SYN 6.5 ES PF (GLOVE) ×6 IMPLANT
GOWN STRL REUS W/ TWL LRG LVL3 (GOWN DISPOSABLE) ×2 IMPLANT
GOWN STRL REUS W/TWL LRG LVL3 (GOWN DISPOSABLE) ×4
IV LACTATED RINGERS 1000ML (IV SOLUTION) ×3 IMPLANT
KIT PROCEDURE FLUENT (KITS) IMPLANT
KIT TURNOVER CYSTO (KITS) ×3 IMPLANT
PACK DNC HYST (MISCELLANEOUS) ×3 IMPLANT
PAD OB MATERNITY 4.3X12.25 (PERSONAL CARE ITEMS) ×3 IMPLANT
PAD PREP 24X41 OB/GYN DISP (PERSONAL CARE ITEMS) ×3 IMPLANT
SEAL ROD LENS SCOPE MYOSURE (ABLATOR) ×3 IMPLANT
TUBING CONNECTING 10 (TUBING) ×2 IMPLANT
TUBING CONNECTING 10' (TUBING) ×1

## 2019-09-15 NOTE — Progress Notes (Signed)
Patient is exhibiting panic attacks when we tried to walk her to the bathroom. Her legs went out and we had to place her back on the stretcher. We took patient to a room and allowed her to use a bedside commode and then placed her in a reclining chair. I proceeded to talk to her mother about discharge instructions and if the patient has had this type of reaction prior and she stated that she has had and it is panic attacks. I will continue to monitor the patient and assess if the need arise to alert Dr. Elesa Massed concerning this.

## 2019-09-15 NOTE — Anesthesia Procedure Notes (Signed)
Procedure Name: LMA Insertion Date/Time: 09/15/2019 8:56 AM Performed by: Elmarie Mainland, CRNA Pre-anesthesia Checklist: Patient identified, Emergency Drugs available, Suction available and Patient being monitored Patient Re-evaluated:Patient Re-evaluated prior to induction Oxygen Delivery Method: Circle system utilized Preoxygenation: Pre-oxygenation with 100% oxygen Induction Type: IV induction Ventilation: Mask ventilation without difficulty LMA: LMA inserted LMA Size: 4.0 Number of attempts: 1 Placement Confirmation: CO2 detector and breath sounds checked- equal and bilateral Tube secured with: Tape Dental Injury: Teeth and Oropharynx as per pre-operative assessment

## 2019-09-15 NOTE — OR Nursing (Signed)
Patient calm, states she feels more relaxed now.   Assisted getting dressed, tolerated well.  Discharge pending ride arrival.

## 2019-09-15 NOTE — Discharge Instructions (Signed)
You should expect to have some cramping and light vaginal bleeding for about a week. This should taper off and subside, much like a period. If heavy bleeding continues or gets worse, you should contact the office for an earlier appointment.   Please call the office or physician on call for fever >101, severe pain, and heavy bleeding.   [phone]  NOTHING IN THE VAGINA FOR 2 WEEKS!!  Dr. Elesa Massed will discuss pathology results with you at your postop visit.  AMBULATORY SURGERY  DISCHARGE INSTRUCTIONS   1) The drugs that you were given will stay in your system until tomorrow so for the next 24 hours you should not:  A) Drive an automobile B) Make any legal decisions C) Drink any alcoholic beverage   2) You may resume regular meals tomorrow.  Today it is better to start with liquids and gradually work up to solid foods.  You may eat anything you prefer, but it is better to start with liquids, then soup and crackers, and gradually work up to solid foods.   3) Please notify your doctor immediately if you have any unusual bleeding, trouble breathing, redness and pain at the surgery site, drainage, fever, or pain not relieved by medication.    4) Additional Instructions:        Please contact your physician with any problems or Same Day Surgery at [phone], Monday through Friday 6 am to 4 pm, or [place] at [facility] number at [phone].

## 2019-09-15 NOTE — Transfer of Care (Signed)
Immediate Anesthesia Transfer of Care Note  Patient: Beth Floyd  Procedure(s) Performed: Procedure(s): DILATATION AND CURETTAGE /HYSTEROSCOPY (N/A)  Patient Location: PACU  Anesthesia Type:General  Level of Consciousness: sedated  Airway & Oxygen Therapy: Patient Spontanous Breathing and Patient connected to face mask oxygen  Post-op Assessment: Report given to RN and Post -op Vital signs reviewed and stable  Post vital signs: Reviewed and stable  Last Vitals:  Vitals:   09/15/19 0955 09/15/19 0957  BP:  107/67  Pulse:  (!) 103  Resp:  19  Temp: (!) (P) 36.2 C (!) 36.2 C  SpO2:  100%    Complications: No apparent anesthesia complications

## 2019-09-15 NOTE — H&P (Signed)
Preoperative History and Physical  Beth Floyd is a 34 y.o. . here for surgical management of abnormal bleeding, with thickened endometrium noted on ultrasoud.   No significant preoperative concerns.  Proposed surgery: dilation and curettage with hysteroscopy  Past Medical History:  Diagnosis Date  . Anemia   . Anxiety   . Arthritis    lower back  . Asthma   . Depression   . Family history of adverse reaction to anesthesia    mother and sister itch after anesthesia and mother feels like she cant beathe  . GERD (gastroesophageal reflux disease)    Past Surgical History:  Procedure Laterality Date  . CESAREAN SECTION     x three  . HERNIA REPAIR     umbilical    OB History  No obstetric history on file.  Patient denies any other pertinent gynecologic issues.   No current facility-administered medications on file prior to encounter.   Current Outpatient Medications on File Prior to Encounter  Medication Sig Dispense Refill  . albuterol (VENTOLIN HFA) 108 (90 Base) MCG/ACT inhaler Inhale 1-2 puffs into the lungs every 6 (six) hours as needed for wheezing or shortness of breath.    . medroxyPROGESTERone (PROVERA) 10 MG tablet Take 5-20 mg by mouth See admin instructions. Take 2 tablet (20 mg) by mouth twice daily x4 days, then 2 tablet (20 mg) by mouth in the morning x 4 days, then 1 tablet (10 mg) by mouth twice daily x4 days, then take 0.5 tablet (5 mg) by mouth daily x4 days.    . butalbital-acetaminophen-caffeine (FIORICET) 50-325-40 MG tablet Take 1 tablet by mouth every 6 (six) hours as needed for headache. (Patient not taking: Reported on 09/09/2019) 20 tablet 0  . meclizine (ANTIVERT) 25 MG tablet Take 1 tablet (25 mg total) by mouth 3 (three) times daily as needed for dizziness. (Patient not taking: Reported on 09/09/2019) 30 tablet 0   Allergies  Allergen Reactions  . Penicillins Hives    Did it involve swelling of the face/tongue/throat, SOB, or low BP? No Did it involve  sudden or severe rash/hives, skin peeling, or any reaction on the inside of your mouth or nose? Unknown Did you need to seek medical attention at a hospital or doctor's office? Yes When did it last happen?~2011 If all above answers are "NO", may proceed with cephalosporin use.     Social History:   reports that she has quit smoking. She has never used smokeless tobacco. She reports that she does not drink alcohol or use drugs.  Family History  Problem Relation Age of Onset  . Hyperlipidemia Mother   . Rheum arthritis Mother   . Celiac disease Mother   . Hypertension Father     Review of Systems: Noncontributory  PHYSICAL EXAM: Blood pressure 137/85, pulse 77, temperature 98.6 F (37 C), temperature source Oral, resp. rate 18, height 5\' 3"  (1.6 m), weight 124.3 kg, last menstrual period 08/29/2019, SpO2 100 %. General appearance - alert, well appearing, and in no distress Chest - clear to auscultation, no wheezes, rales or rhonchi, symmetric air entry Heart - normal rate and regular rhythm Abdomen - soft, nontender, nondistended, no masses or organomegaly Pelvic - examination not indicated Extremities - peripheral pulses normal, no pedal edema, no clubbing or cyanosis  Labs: Results for orders placed or performed during the hospital encounter of 09/15/19 (from the past 336 hour(s))  Pregnancy, urine POC   Collection Time: 09/15/19  7:41 AM  Result Value Ref  Range   Preg Test, Ur NEGATIVE NEGATIVE  Results for orders placed or performed during the hospital encounter of 09/11/19 (from the past 336 hour(s))  SARS CORONAVIRUS 2 (TAT 6-24 HRS) Nasopharyngeal Nasopharyngeal Swab   Collection Time: 09/11/19  2:03 PM   Specimen: Nasopharyngeal Swab  Result Value Ref Range   SARS Coronavirus 2 NEGATIVE NEGATIVE    Imaging Studies: US PELVIS TRANSVAGINAL NON-OB (TV ONLY)  Result Date: 09/05/2019 CLINICAL DATA:  Abnormal uterine bleeding EXAM: ULTRASOUND PELVIS TRANSVAGINAL  TECHNIQUE: Transvaginal ultrasound examination of the pelvis was performed including evaluation of the uterus, ovaries, adnexal regions, and pelvic cul-de-sac. Transabdominal imaging not ordered COMPARISON:  None FINDINGS: Uterus Measurements: 8.0 x 4.8 x 5.4 cm = volume: 108 mL. Anteverted. Nabothian cysts at cervix. Otherwise normal morphology without mass Endometrium Thickness: 20 mm, abnormally thickened. Minimally inhomogeneous. No focal mass lesion or fluid. Right ovary Measurements: 2.5 x 1.8 x 1.5 cm = volume: 3.5 mL. Normal morphology without mass Left ovary Measurements: 3.6 x 2.9 x 2.6 cm = volume: 14.2 mL. Dominant follicle without mass Other findings:  No free pelvic fluid or adnexal masses IMPRESSION: Abnormal thickened and minimally inhomogeneous endometrial complex 20 mm thick. If bleeding remains unresponsive to hormonal or medical therapy, focal lesion work-up with sonohysterogram should be considered. Endometrial biopsy should also be considered in pre-menopausal patients at high risk for endometrial carcinoma. (Ref: Radiological Reasoning: Algorithmic Workup of Abnormal Vaginal Bleeding with Endovaginal Sonography and Sonohysterography. AJR 2008; 532:D92-42) Electronically Signed   By: Ulyses Southward M.D.   On: 09/05/2019 11:20    Assessment: Thickened endometrium, abnormal uterine bleeding  Plan: Patient will undergo surgical management with dilation and curettage with hysteroscopy.   The risks of surgery were discussed in detail with the patient including but not limited to: bleeding which may require transfusion or reoperation; infection which may require antibiotics; injury to surrounding organs which may involve bowel, bladder, ureters ; need for additional procedures including laparoscopy or laparotomy; thromboembolic phenomenon, surgical site problems and other postoperative/anesthesia complications. Likelihood of success in alleviating the patient's condition was discussed. Routine  postoperative instructions will be reviewed with the patient and her family in detail after surgery.  The patient concurred with the proposed plan, giving informed written consent for the surgery.  Patient has been NPO since last night she will remain NPO for procedure.  Anesthesia and OR aware.  SCDs ordered on call to the OR.  To OR when ready.  ----- Ranae Plumber, MD, FACOG Attending Obstetrician and Gynecologist [facility], Department of OB/GYN [facility] . 09/15/2019 8:50 AM

## 2019-09-15 NOTE — Anesthesia Preprocedure Evaluation (Addendum)
Anesthesia Evaluation  Patient identified by MRN, date of birth, ID band Patient awake    Reviewed: Allergy & Precautions, NPO status , Patient's Chart, lab work & pertinent test results  History of Anesthesia Complications Negative for: history of anesthetic complications  Airway Mallampati: II  TM Distance: >3 FB Neck ROM: Full    Dental no notable dental hx.    Pulmonary asthma , neg sleep apnea, neg COPD, former smoker,    breath sounds clear to auscultation- rhonchi (-) wheezing      Cardiovascular Exercise Tolerance: Good (-) hypertension(-) CAD, (-) Past MI, (-) Cardiac Stents and (-) CABG  Rhythm:Regular Rate:Normal - Systolic murmurs and - Diastolic murmurs    Neuro/Psych neg Seizures PSYCHIATRIC DISORDERS Anxiety Depression negative neurological ROS     GI/Hepatic Neg liver ROS, GERD  ,  Endo/Other  negative endocrine ROSneg diabetes  Renal/GU negative Renal ROS     Musculoskeletal  (+) Arthritis ,   Abdominal (+) + obese,   Peds  Hematology  (+) anemia ,   Anesthesia Other Findings Past Medical History: No date: Anemia No date: Anxiety No date: Arthritis     Comment:  lower back No date: Asthma No date: Depression No date: Family history of adverse reaction to anesthesia     Comment:  mother and sister itch after anesthesia and mother feels              like she cant beathe No date: GERD (gastroesophageal reflux disease)   Reproductive/Obstetrics                            Anesthesia Physical Anesthesia Plan  ASA: II  Anesthesia Plan: General   Post-op Pain Management:    Induction: Intravenous  PONV Risk Score and Plan: 2 and Ondansetron, Dexamethasone and Midazolam  Airway Management Planned: Oral ETT  Additional Equipment:   Intra-op Plan:   Post-operative Plan: Extubation in OR  Informed Consent: I have reviewed the patients History and Physical,  chart, labs and discussed the procedure including the risks, benefits and alternatives for the proposed anesthesia with the patient or authorized representative who has indicated his/her understanding and acceptance.     Dental advisory given  Plan Discussed with: CRNA and Anesthesiologist  Anesthesia Plan Comments:        Anesthesia Quick Evaluation

## 2019-09-15 NOTE — Anesthesia Postprocedure Evaluation (Signed)
Anesthesia Post Note  Patient: Beth Floyd  Procedure(s) Performed: DILATATION AND CURETTAGE /HYSTEROSCOPY (N/A )  Patient location during evaluation: PACU Anesthesia Type: General Level of consciousness: awake and alert and oriented Pain management: pain level controlled Vital Signs Assessment: post-procedure vital signs reviewed and stable Respiratory status: spontaneous breathing, nonlabored ventilation and respiratory function stable Cardiovascular status: blood pressure returned to baseline and stable Postop Assessment: no signs of nausea or vomiting Anesthetic complications: no     Last Vitals:  Vitals:   09/15/19 1052 09/15/19 1122  BP: 138/82 108/67  Pulse: 81 83  Resp: 18 16  Temp: (!) 36.2 C   SpO2: 100% 100%    Last Pain:  Vitals:   09/15/19 1154  TempSrc: Temporal  PainSc:                  [name]

## 2019-09-15 NOTE — Op Note (Signed)
Operative Report Hysteroscopy, Dilation and Curettage 09/15/2019  Patient:  Beth Floyd  34 y.o. female Preoperative diagnosis:  bleeding Postoperative diagnosis:  bleeding  PROCEDURE:  Procedure(s): DILATATION AND CURETTAGE /HYSTEROSCOPY (N/A) Surgeon:  Surgeon(s) and Role:    * Willow Shidler, Elenora Fender, MD - Primary Anesthesia:  LMA I/O: Total I/O In: 700 [I.V.:700] Out: -  100cc EBL, no UOP Specimens:  Endometrial curettings Complications: None Apparent Disposition:  VS stable to PACU  Findings: Uterus, mobile, normal size, sounding to 11/5 cm; normal cervix, vagina, perineum.  Hysteroscopic findings:  Universal thickening in the uterine cavity - no discernable polyp or base.  Copious tissue removed.  Indication for procedure/Consents: 34 y.o. No obstetric history on file.  here for scheduled surgery for the aforementioned diagnoses. Risks of surgery were discussed with the patient including but not limited to: bleeding which may require transfusion; infection which may require antibiotics; injury to uterus or surrounding organs; intrauterine scarring which may impair future fertility; need for additional procedures including laparotomy or laparoscopy; and other postoperative/anesthesia complications. Written informed consent was obtained.    Procedure Details:   The patient was then taken to the operating room where anesthesia was administered and was found to be adequate.  After a formal timeout was performed, she was placed in the dorsal lithotomy position and examined with the above findings. She was then prepped and draped in the sterile manner.  A speculum was then placed in the patient's vagina and a single tooth tenaculum was applied to the anterior lip of the cervix.    The uterus was sounded to 11.5cm. Her cervix was serially dilated to accommodate the myoscope, with findings as above. A sharp curettage was then performed until there was a gritty texture in all four quadrants. The  specimen was handed off to nursing. The camera was reinserted and confirmed the uterus had been evacuated. The tenaculum was removed from the anterior lip of the cervix and the vaginal speculum was removed after noting good hemostasis. The patient tolerated the procedure well and was taken to the recovery area awake, extubated and in stable condition.  The patient will be discharged to home as per PACU criteria.  Routine postoperative instructions given. She will follow up in the clinic in two to four weeks for postoperative evaluation.  Ranae Plumber, MD [facility] Attending Gynecologist

## 2019-09-16 LAB — SURGICAL PATHOLOGY

## 2021-02-18 ENCOUNTER — Other Ambulatory Visit: Payer: Self-pay

## 2021-02-18 DIAGNOSIS — N939 Abnormal uterine and vaginal bleeding, unspecified: Secondary | ICD-10-CM | POA: Insufficient documentation

## 2021-02-18 DIAGNOSIS — Z5321 Procedure and treatment not carried out due to patient leaving prior to being seen by health care provider: Secondary | ICD-10-CM | POA: Insufficient documentation

## 2021-02-18 LAB — BASIC METABOLIC PANEL
Anion gap: 6 (ref 5–15)
BUN: 11 mg/dL (ref 6–20)
CO2: 25 mmol/L (ref 22–32)
Calcium: 9.2 mg/dL (ref 8.9–10.3)
Chloride: 107 mmol/L (ref 98–111)
Creatinine, Ser: 0.77 mg/dL (ref 0.44–1.00)
GFR, Estimated: >60 mL/min (ref >60)
Glucose, Bld: 113 mg/dL — ABNORMAL HIGH (ref 70–99)
Potassium: 3.9 mmol/L (ref 3.5–5.1)
Sodium: 138 mmol/L (ref 135–145)

## 2021-02-18 LAB — URINALYSIS, COMPLETE (UACMP) WITH MICROSCOPIC
Bacteria, UA: NONE SEEN
Bilirubin Urine: NEGATIVE
Glucose, UA: NEGATIVE mg/dL
Ketones, ur: NEGATIVE mg/dL
Leukocytes,Ua: NEGATIVE
Nitrite: NEGATIVE
Protein, ur: NEGATIVE mg/dL
Specific Gravity, Urine: 1.018 (ref 1.005–1.030)
pH: 8 (ref 5.0–8.0)

## 2021-02-18 LAB — CBC WITH DIFFERENTIAL/PLATELET
Abs Immature Granulocytes: 0.04 10*3/uL (ref 0.00–0.07)
Basophils Absolute: 0 10*3/uL (ref 0.0–0.1)
Basophils Relative: 0 %
Eosinophils Absolute: 0 10*3/uL (ref 0.0–0.5)
Eosinophils Relative: 0 %
HCT: 31.6 % — ABNORMAL LOW (ref 36.0–46.0)
Hemoglobin: 10.5 g/dL — ABNORMAL LOW (ref 12.0–15.0)
Immature Granulocytes: 1 %
Lymphocytes Relative: 26 %
Lymphs Abs: 2.2 10*3/uL (ref 0.7–4.0)
MCH: 28.2 pg (ref 26.0–34.0)
MCHC: 33.2 g/dL (ref 30.0–36.0)
MCV: 84.9 fL (ref 80.0–100.0)
Monocytes Absolute: 0.2 10*3/uL (ref 0.1–1.0)
Monocytes Relative: 2 %
Neutro Abs: 6 10*3/uL (ref 1.7–7.7)
Neutrophils Relative %: 71 %
Platelets: 290 10*3/uL (ref 150–400)
RBC: 3.72 MIL/uL — ABNORMAL LOW (ref 3.87–5.11)
RDW: 14.2 % (ref 11.5–15.5)
WBC: 8.4 10*3/uL (ref 4.0–10.5)
nRBC: 0 % (ref 0.0–0.2)

## 2021-02-18 LAB — POC URINE PREG, ED: Preg Test, Ur: NEGATIVE

## 2021-02-18 NOTE — ED Triage Notes (Signed)
Pt reports light vaginal bleeding that started 01/29/21 and has been gradually increasing in amount since. Pt denies abnormal dizziness or lightheadedness. Pt does report constant fatigue. Hx of same that required surgical intervention.

## 2021-02-19 ENCOUNTER — Emergency Department
Admission: EM | Admit: 2021-02-19 | Discharge: 2021-02-19 | Disposition: A | Discharge status: Home / Self Care | Payer: Medicaid Other | Attending: Emergency Medicine | Admitting: Emergency Medicine

## 2021-03-10 ENCOUNTER — Other Ambulatory Visit: Payer: Self-pay

## 2021-03-10 ENCOUNTER — Encounter: Payer: Self-pay | Admitting: *Deleted

## 2021-03-10 DIAGNOSIS — Z87891 Personal history of nicotine dependence: Secondary | ICD-10-CM | POA: Diagnosis not present

## 2021-03-10 DIAGNOSIS — N939 Abnormal uterine and vaginal bleeding, unspecified: Secondary | ICD-10-CM | POA: Insufficient documentation

## 2021-03-10 DIAGNOSIS — N9489 Other specified conditions associated with female genital organs and menstrual cycle: Secondary | ICD-10-CM | POA: Diagnosis not present

## 2021-03-10 DIAGNOSIS — J45909 Unspecified asthma, uncomplicated: Secondary | ICD-10-CM | POA: Diagnosis not present

## 2021-03-10 LAB — CBC
HCT: 29.9 % — ABNORMAL LOW (ref 36.0–46.0)
Hemoglobin: 9.9 g/dL — ABNORMAL LOW (ref 12.0–15.0)
MCH: 28.7 pg (ref 26.0–34.0)
MCHC: 33.1 g/dL (ref 30.0–36.0)
MCV: 86.7 fL (ref 80.0–100.0)
Platelets: 371 10*3/uL (ref 150–400)
RBC: 3.45 MIL/uL — ABNORMAL LOW (ref 3.87–5.11)
RDW: 15.4 % (ref 11.5–15.5)
WBC: 10.6 10*3/uL — ABNORMAL HIGH (ref 4.0–10.5)
nRBC: 0 % (ref 0.0–0.2)

## 2021-03-10 LAB — COMPREHENSIVE METABOLIC PANEL
ALT: 32 U/L (ref 0–44)
AST: 27 U/L (ref 15–41)
Albumin: 3.9 g/dL (ref 3.5–5.0)
Alkaline Phosphatase: 40 U/L (ref 38–126)
Anion gap: 6 (ref 5–15)
BUN: 11 mg/dL (ref 6–20)
CO2: 24 mmol/L (ref 22–32)
Calcium: 9.1 mg/dL (ref 8.9–10.3)
Chloride: 108 mmol/L (ref 98–111)
Creatinine, Ser: 0.97 mg/dL (ref 0.44–1.00)
GFR, Estimated: >60 mL/min (ref >60)
Glucose, Bld: 98 mg/dL (ref 70–99)
Potassium: 3.7 mmol/L (ref 3.5–5.1)
Sodium: 138 mmol/L (ref 135–145)
Total Bilirubin: 0.9 mg/dL (ref 0.3–1.2)
Total Protein: 7 g/dL (ref 6.5–8.1)

## 2021-03-10 LAB — HCG, QUANTITATIVE, PREGNANCY: hCG, Beta Chain, Quant, S: <1 m[IU]/mL (ref <5)

## 2021-03-10 LAB — TYPE AND SCREEN
ABO/RH(D): O POS
Antibody Screen: NEGATIVE

## 2021-03-10 NOTE — ED Triage Notes (Signed)
Pt reports vaginal bleeding since June approx 8 weeks.  Pt has abd cramping.  Pt reports passing large clots of blood.  Pt alert  speech clear

## 2021-03-11 ENCOUNTER — Emergency Department
Admission: EM | Admit: 2021-03-11 | Discharge: 2021-03-11 | Disposition: A | Discharge status: Home / Self Care | Payer: Medicaid Other | Attending: Emergency Medicine | Admitting: Emergency Medicine

## 2021-03-11 ENCOUNTER — Emergency Department: Payer: Medicaid Other

## 2021-03-11 DIAGNOSIS — N939 Abnormal uterine and vaginal bleeding, unspecified: Secondary | ICD-10-CM

## 2021-03-11 DIAGNOSIS — R52 Pain, unspecified: Secondary | ICD-10-CM

## 2021-03-11 MED ORDER — IBUPROFEN 600 MG PO TABS: 600.0000 mg | ORAL_TABLET | Freq: Three times a day (TID) | ORAL | 0 refills | Status: AC | PRN

## 2021-03-11 MED ORDER — ONDANSETRON 4 MG PO TBDP
4.0000 mg | ORAL_TABLET | Freq: Three times a day (TID) | ORAL | 0 refills | Status: DC | PRN
Start: 2021-03-11 — End: 2021-08-16

## 2021-03-11 MED ORDER — MEDROXYPROGESTERONE ACETATE 10 MG PO TABS: 10.0000 mg | ORAL_TABLET | Freq: Every day | ORAL | 0 refills | Status: DC
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
## 2021-08-12 ENCOUNTER — Other Ambulatory Visit: Payer: Self-pay | Admitting: *Deleted

## 2021-08-12 ENCOUNTER — Encounter: Payer: Self-pay | Admitting: *Deleted

## 2021-08-16 ENCOUNTER — Encounter: Payer: Self-pay | Admitting: Internal Medicine

## 2021-08-16 ENCOUNTER — Encounter (INDEPENDENT_AMBULATORY_CARE_PROVIDER_SITE_OTHER): Payer: Self-pay

## 2021-08-16 ENCOUNTER — Inpatient Hospital Stay: Payer: Medicaid Other

## 2021-08-16 ENCOUNTER — Other Ambulatory Visit: Payer: Self-pay

## 2021-08-16 ENCOUNTER — Inpatient Hospital Stay: Payer: Medicaid Other | Attending: Internal Medicine | Admitting: Internal Medicine

## 2021-08-16 DIAGNOSIS — Z8349 Family history of other endocrine, nutritional and metabolic diseases: Secondary | ICD-10-CM | POA: Insufficient documentation

## 2021-08-16 DIAGNOSIS — D500 Iron deficiency anemia secondary to blood loss (chronic): Secondary | ICD-10-CM | POA: Diagnosis present

## 2021-08-16 DIAGNOSIS — Z79899 Other long term (current) drug therapy: Secondary | ICD-10-CM | POA: Insufficient documentation

## 2021-08-16 DIAGNOSIS — R5383 Other fatigue: Secondary | ICD-10-CM | POA: Diagnosis not present

## 2021-08-16 DIAGNOSIS — M542 Cervicalgia: Secondary | ICD-10-CM | POA: Diagnosis not present

## 2021-08-16 DIAGNOSIS — N920 Excessive and frequent menstruation with regular cycle: Secondary | ICD-10-CM | POA: Diagnosis not present

## 2021-08-16 DIAGNOSIS — Z8261 Family history of arthritis: Secondary | ICD-10-CM | POA: Diagnosis not present

## 2021-08-16 DIAGNOSIS — Z8379 Family history of other diseases of the digestive system: Secondary | ICD-10-CM | POA: Diagnosis not present

## 2021-08-16 DIAGNOSIS — R000 Tachycardia, unspecified: Secondary | ICD-10-CM | POA: Insufficient documentation

## 2021-08-16 DIAGNOSIS — Z880 Allergy status to penicillin: Secondary | ICD-10-CM | POA: Diagnosis not present

## 2021-08-16 DIAGNOSIS — Z87891 Personal history of nicotine dependence: Secondary | ICD-10-CM | POA: Insufficient documentation

## 2021-08-16 DIAGNOSIS — M2550 Pain in unspecified joint: Secondary | ICD-10-CM | POA: Insufficient documentation

## 2021-08-16 DIAGNOSIS — Z8249 Family history of ischemic heart disease and other diseases of the circulatory system: Secondary | ICD-10-CM | POA: Diagnosis not present

## 2021-08-16 DIAGNOSIS — D649 Anemia, unspecified: Secondary | ICD-10-CM

## 2021-08-16 DIAGNOSIS — M797 Fibromyalgia: Secondary | ICD-10-CM | POA: Diagnosis not present

## 2021-08-16 DIAGNOSIS — Z91030 Bee allergy status: Secondary | ICD-10-CM | POA: Insufficient documentation

## 2021-08-16 LAB — CBC WITH DIFFERENTIAL/PLATELET
Abs Immature Granulocytes: 0.04 10*3/uL (ref 0.00–0.07)
Basophils Absolute: 0 10*3/uL (ref 0.0–0.1)
Basophils Relative: 0 %
Eosinophils Absolute: 0.2 10*3/uL (ref 0.0–0.5)
Eosinophils Relative: 2 %
HCT: 35 % — ABNORMAL LOW (ref 36.0–46.0)
Hemoglobin: 11.1 g/dL — ABNORMAL LOW (ref 12.0–15.0)
Immature Granulocytes: 0 %
Lymphocytes Relative: 34 %
Lymphs Abs: 3.4 10*3/uL (ref 0.7–4.0)
MCH: 25.4 pg — ABNORMAL LOW (ref 26.0–34.0)
MCHC: 31.7 g/dL (ref 30.0–36.0)
MCV: 80.1 fL (ref 80.0–100.0)
Monocytes Absolute: 0.6 10*3/uL (ref 0.1–1.0)
Monocytes Relative: 6 %
Neutro Abs: 5.6 10*3/uL (ref 1.7–7.7)
Neutrophils Relative %: 58 %
Platelets: 325 10*3/uL (ref 150–400)
RBC: 4.37 MIL/uL (ref 3.87–5.11)
RDW: 24.2 % — ABNORMAL HIGH (ref 11.5–15.5)
WBC: 9.8 10*3/uL (ref 4.0–10.5)
nRBC: 0 % (ref 0.0–0.2)

## 2021-08-16 LAB — LACTATE DEHYDROGENASE: LDH: 193 U/L — ABNORMAL HIGH (ref 98–192)

## 2021-08-16 LAB — COMPREHENSIVE METABOLIC PANEL
ALT: 155 U/L — ABNORMAL HIGH (ref 0–44)
AST: 65 U/L — ABNORMAL HIGH (ref 15–41)
Albumin: 3.8 g/dL (ref 3.5–5.0)
Alkaline Phosphatase: 68 U/L (ref 38–126)
Anion gap: 5 (ref 5–15)
BUN: 9 mg/dL (ref 6–20)
CO2: 24 mmol/L (ref 22–32)
Calcium: 8.9 mg/dL (ref 8.9–10.3)
Chloride: 106 mmol/L (ref 98–111)
Creatinine, Ser: 0.92 mg/dL (ref 0.44–1.00)
GFR, Estimated: >60 mL/min (ref >60)
Glucose, Bld: 88 mg/dL (ref 70–99)
Potassium: 4 mmol/L (ref 3.5–5.1)
Sodium: 135 mmol/L (ref 135–145)
Total Bilirubin: 1 mg/dL (ref 0.3–1.2)
Total Protein: 7.4 g/dL (ref 6.5–8.1)

## 2021-08-16 LAB — IRON AND TIBC
Iron: 59 ug/dL (ref 28–170)
Saturation Ratios: 14 % (ref 10.4–31.8)
TIBC: 435 ug/dL (ref 250–450)
UIBC: 376 ug/dL

## 2021-08-16 LAB — FERRITIN: Ferritin: 11 ng/mL (ref 11–307)
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
## 2021-08-16 NOTE — Progress Notes (Signed)
Pt states she feels like her heart rate beats fast/racing at times.

## 2021-08-16 NOTE — Assessment & Plan Note (Addendum)
#   Anemia-severe iron deficiency-NOV 2022- hemoglobin:8.4 ferritin-5; iron saturation:5%  Poor tolerance to p.o. iron/constipation.  # Patient is symptomatic from worsening fatigue-recommend IV iron infusions. Discussed the potential acute infusion reactions with IV iron; which are quite rare.  Patient understands the risk; will proceed with infusions.  #Etiology: I discussed multiple etiologies with the patient including but not limited to malabsorption/blood loss/nutrition etc. most likely cause of patient's iron deficiency is her menorrhagia.  Gyn [Dr.Ward]- ? In [place].  Encouraged the patient to follow-up with gynecology.   Thank you, Jose-Matthews for allowing me to participate in the care of your pleasant patient. Please do not hesitate to contact me with questions or concerns in the interim.  # DISPOSITION: # labs today- order cbc/CMP;LDH; iron studies/ferritin # venofer weekly x4- start ASAP  # follow up in 2 months- MD; labs- cbc- possible venofer-Dr.B

## 2021-08-17 ENCOUNTER — Other Ambulatory Visit: Payer: Self-pay | Admitting: *Deleted

## 2021-08-17 ENCOUNTER — Telehealth: Payer: Self-pay | Admitting: Internal Medicine

## 2021-08-17 DIAGNOSIS — R7989 Other specified abnormal findings of blood chemistry: Secondary | ICD-10-CM

## 2021-08-17 DIAGNOSIS — D649 Anemia, unspecified: Secondary | ICD-10-CM

## 2021-08-17 NOTE — Telephone Encounter (Signed)
Patient notified of the need for additional lab work.

## 2021-08-17 NOTE — Telephone Encounter (Signed)
Unable to reach the patient; a left a voicemail-that hemoglobin is improved; however proceed with IV iron as planned.  Given elevated LFTs-recommend checking hepatitis work-up; and repeat LFTs [ordered].  H/A-please inform patient of above.  C- please schedule labs on 1/16- with IV infusion.  GB

## 2021-08-17 NOTE — Telephone Encounter (Signed)
Labs have been ordered for pt 

## 2021-08-22 ENCOUNTER — Other Ambulatory Visit: Payer: Self-pay

## 2021-08-22 ENCOUNTER — Inpatient Hospital Stay: Payer: Medicaid Other

## 2021-08-22 VITALS — BP 123/68 | HR 65 | Temp 98.4°F

## 2021-08-22 DIAGNOSIS — D649 Anemia, unspecified: Secondary | ICD-10-CM

## 2021-08-22 DIAGNOSIS — D500 Iron deficiency anemia secondary to blood loss (chronic): Secondary | ICD-10-CM | POA: Diagnosis not present

## 2021-08-22 DIAGNOSIS — R7989 Other specified abnormal findings of blood chemistry: Secondary | ICD-10-CM

## 2021-08-22 LAB — CBC WITH DIFFERENTIAL/PLATELET
Abs Immature Granulocytes: 0.05 10*3/uL (ref 0.00–0.07)
Basophils Absolute: 0.1 10*3/uL (ref 0.0–0.1)
Basophils Relative: 1 %
Eosinophils Absolute: 0.2 10*3/uL (ref 0.0–0.5)
Eosinophils Relative: 2 %
HCT: 35.2 % — ABNORMAL LOW (ref 36.0–46.0)
Hemoglobin: 11.3 g/dL — ABNORMAL LOW (ref 12.0–15.0)
Immature Granulocytes: 1 %
Lymphocytes Relative: 32 %
Lymphs Abs: 3.4 10*3/uL (ref 0.7–4.0)
MCH: 25.6 pg — ABNORMAL LOW (ref 26.0–34.0)
MCHC: 32.1 g/dL (ref 30.0–36.0)
MCV: 79.8 fL — ABNORMAL LOW (ref 80.0–100.0)
Monocytes Absolute: 0.5 10*3/uL (ref 0.1–1.0)
Monocytes Relative: 5 %
Neutro Abs: 6.4 10*3/uL (ref 1.7–7.7)
Neutrophils Relative %: 59 %
Platelets: 320 10*3/uL (ref 150–400)
RBC: 4.41 MIL/uL (ref 3.87–5.11)
RDW: 23.8 % — ABNORMAL HIGH (ref 11.5–15.5)
WBC: 10.6 10*3/uL — ABNORMAL HIGH (ref 4.0–10.5)
nRBC: 0 % (ref 0.0–0.2)

## 2021-08-22 LAB — COMPREHENSIVE METABOLIC PANEL
ALT: 121 U/L — ABNORMAL HIGH (ref 0–44)
AST: 51 U/L — ABNORMAL HIGH (ref 15–41)
Albumin: 3.9 g/dL (ref 3.5–5.0)
Alkaline Phosphatase: 71 U/L (ref 38–126)
Anion gap: 9 (ref 5–15)
BUN: 13 mg/dL (ref 6–20)
CO2: 22 mmol/L (ref 22–32)
Calcium: 9.1 mg/dL (ref 8.9–10.3)
Chloride: 104 mmol/L (ref 98–111)
Creatinine, Ser: 0.81 mg/dL (ref 0.44–1.00)
GFR, Estimated: >60 mL/min (ref >60)
Glucose, Bld: 104 mg/dL — ABNORMAL HIGH (ref 70–99)
Potassium: 3.5 mmol/L (ref 3.5–5.1)
Sodium: 135 mmol/L (ref 135–145)
Total Bilirubin: 0.9 mg/dL (ref 0.3–1.2)
Total Protein: 7.4 g/dL (ref 6.5–8.1)

## 2021-08-22 LAB — HEPATITIS B CORE ANTIBODY, IGM: Hep B C IgM: NONREACTIVE

## 2021-08-22 LAB — HEPATITIS C ANTIBODY: HCV Ab: NONREACTIVE

## 2021-08-22 LAB — IRON AND TIBC
Iron: 51 ug/dL (ref 28–170)
Saturation Ratios: 12 % (ref 10.4–31.8)
TIBC: 430 ug/dL (ref 250–450)
UIBC: 379 ug/dL

## 2021-08-22 LAB — FERRITIN: Ferritin: 10 ng/mL — ABNORMAL LOW (ref 11–307)

## 2021-08-22 LAB — HEPATITIS A ANTIBODY, TOTAL: hep A Total Ab: NONREACTIVE

## 2021-08-22 LAB — HEPATITIS B SURFACE ANTIGEN: Hepatitis B Surface Ag: NONREACTIVE

## 2021-08-22 LAB — LACTATE DEHYDROGENASE: LDH: 165 U/L (ref 98–192)

## 2021-08-22 MED ORDER — SODIUM CHLORIDE 0.9 % IV SOLN
Freq: Once | INTRAVENOUS | Status: AC
  Filled 2021-08-22: qty 250

## 2021-08-22 MED ORDER — SODIUM CHLORIDE 0.9 % IV SOLN: 200.0000 mg | Freq: Once | INTRAVENOUS | Status: DC

## 2021-08-22 MED ORDER — IRON SUCROSE 20 MG/ML IV SOLN
200.0000 mg | Freq: Once | INTRAVENOUS | Status: AC
  Administered 2021-08-22: 200 mg via INTRAVENOUS
  Filled 2021-08-22: qty 10

## 2021-08-22 NOTE — Patient Instructions (Signed)

## 2021-08-25 ENCOUNTER — Encounter: Payer: Self-pay | Admitting: Internal Medicine

## 2021-08-26 ENCOUNTER — Other Ambulatory Visit: Payer: Self-pay

## 2021-08-26 ENCOUNTER — Other Ambulatory Visit: Payer: Self-pay | Admitting: Internal Medicine

## 2021-08-26 ENCOUNTER — Encounter: Payer: Self-pay | Admitting: Internal Medicine

## 2021-08-26 DIAGNOSIS — R7989 Other specified abnormal findings of blood chemistry: Secondary | ICD-10-CM

## 2021-08-26 DIAGNOSIS — D649 Anemia, unspecified: Secondary | ICD-10-CM

## 2021-08-26 NOTE — Progress Notes (Signed)
Please order Labs/ LFTs on 2/06 appt.   GB

## 2021-08-26 NOTE — Progress Notes (Signed)
Done

## 2021-08-26 NOTE — Progress Notes (Signed)
Discussed with the patient the results of the liver numbers/improved but not resolved. Discuss option of ultrasound/repeating the numbers. For now will repeat LFTs.

## 2021-08-29 ENCOUNTER — Other Ambulatory Visit: Payer: Self-pay

## 2021-08-29 ENCOUNTER — Inpatient Hospital Stay: Payer: Medicaid Other

## 2021-08-29 NOTE — Progress Notes (Signed)
Unable to obtain IV access for venofer today after this RN and 2 different RN's attempted. Advised pt to increase fluids for next visit.

## 2021-09-05 ENCOUNTER — Inpatient Hospital Stay: Payer: Medicaid Other

## 2021-09-05 ENCOUNTER — Other Ambulatory Visit: Payer: Self-pay

## 2021-09-05 VITALS — BP 115/95 | HR 73

## 2021-09-05 DIAGNOSIS — D500 Iron deficiency anemia secondary to blood loss (chronic): Secondary | ICD-10-CM | POA: Diagnosis not present

## 2021-09-05 DIAGNOSIS — D649 Anemia, unspecified: Secondary | ICD-10-CM

## 2021-09-05 MED ORDER — SODIUM CHLORIDE 0.9 % IV SOLN: 200.0000 mg | Freq: Once | INTRAVENOUS | Status: DC

## 2021-09-05 MED ORDER — SODIUM CHLORIDE 0.9 % IV SOLN
Freq: Once | INTRAVENOUS | Status: AC
  Filled 2021-09-05: qty 250

## 2021-09-05 MED ORDER — IRON SUCROSE 20 MG/ML IV SOLN
200.0000 mg | Freq: Once | INTRAVENOUS | Status: AC
  Administered 2021-09-05: 200 mg via INTRAVENOUS
  Filled 2021-09-05: qty 10

## 2021-09-12 ENCOUNTER — Inpatient Hospital Stay: Payer: Medicaid Other | Attending: Internal Medicine

## 2021-09-19 ENCOUNTER — Inpatient Hospital Stay: Payer: Medicaid Other

## 2021-10-11 ENCOUNTER — Other Ambulatory Visit: Payer: Self-pay | Admitting: *Deleted

## 2021-10-11 DIAGNOSIS — D649 Anemia, unspecified: Secondary | ICD-10-CM

## 2021-10-18 ENCOUNTER — Encounter: Payer: Self-pay | Admitting: Internal Medicine

## 2021-10-18 ENCOUNTER — Other Ambulatory Visit: Payer: Self-pay

## 2021-10-18 ENCOUNTER — Inpatient Hospital Stay: Payer: Medicaid Other

## 2021-10-18 ENCOUNTER — Inpatient Hospital Stay (HOSPITAL_BASED_OUTPATIENT_CLINIC_OR_DEPARTMENT_OTHER): Payer: Medicaid Other | Admitting: Internal Medicine

## 2021-10-18 ENCOUNTER — Inpatient Hospital Stay: Payer: Medicaid Other | Attending: Internal Medicine

## 2021-10-18 DIAGNOSIS — D649 Anemia, unspecified: Secondary | ICD-10-CM

## 2021-10-18 DIAGNOSIS — Z8349 Family history of other endocrine, nutritional and metabolic diseases: Secondary | ICD-10-CM | POA: Insufficient documentation

## 2021-10-18 DIAGNOSIS — Z880 Allergy status to penicillin: Secondary | ICD-10-CM | POA: Diagnosis not present

## 2021-10-18 DIAGNOSIS — R5383 Other fatigue: Secondary | ICD-10-CM | POA: Insufficient documentation

## 2021-10-18 DIAGNOSIS — D500 Iron deficiency anemia secondary to blood loss (chronic): Secondary | ICD-10-CM | POA: Insufficient documentation

## 2021-10-18 DIAGNOSIS — M791 Myalgia, unspecified site: Secondary | ICD-10-CM | POA: Insufficient documentation

## 2021-10-18 DIAGNOSIS — M2550 Pain in unspecified joint: Secondary | ICD-10-CM | POA: Insufficient documentation

## 2021-10-18 DIAGNOSIS — Z8261 Family history of arthritis: Secondary | ICD-10-CM | POA: Diagnosis not present

## 2021-10-18 DIAGNOSIS — Z8379 Family history of other diseases of the digestive system: Secondary | ICD-10-CM | POA: Insufficient documentation

## 2021-10-18 DIAGNOSIS — Z91030 Bee allergy status: Secondary | ICD-10-CM | POA: Diagnosis not present

## 2021-10-18 DIAGNOSIS — Z8249 Family history of ischemic heart disease and other diseases of the circulatory system: Secondary | ICD-10-CM | POA: Diagnosis not present

## 2021-10-18 DIAGNOSIS — Z79899 Other long term (current) drug therapy: Secondary | ICD-10-CM | POA: Diagnosis not present

## 2021-10-18 DIAGNOSIS — M542 Cervicalgia: Secondary | ICD-10-CM | POA: Insufficient documentation

## 2021-10-18 DIAGNOSIS — R7989 Other specified abnormal findings of blood chemistry: Secondary | ICD-10-CM

## 2021-10-18 DIAGNOSIS — N920 Excessive and frequent menstruation with regular cycle: Secondary | ICD-10-CM | POA: Insufficient documentation

## 2021-10-18 LAB — CBC WITH DIFFERENTIAL/PLATELET
Abs Immature Granulocytes: 0.05 10*3/uL (ref 0.00–0.07)
Basophils Absolute: 0.1 10*3/uL (ref 0.0–0.1)
Basophils Relative: 1 %
Eosinophils Absolute: 0.2 10*3/uL (ref 0.0–0.5)
Eosinophils Relative: 2 %
HCT: 38.4 % (ref 36.0–46.0)
Hemoglobin: 12.9 g/dL (ref 12.0–15.0)
Immature Granulocytes: 1 %
Lymphocytes Relative: 35 %
Lymphs Abs: 3.9 10*3/uL (ref 0.7–4.0)
MCH: 28.8 pg (ref 26.0–34.0)
MCHC: 33.6 g/dL (ref 30.0–36.0)
MCV: 85.7 fL (ref 80.0–100.0)
Monocytes Absolute: 0.7 10*3/uL (ref 0.1–1.0)
Monocytes Relative: 7 %
Neutro Abs: 6 10*3/uL (ref 1.7–7.7)
Neutrophils Relative %: 54 %
Platelets: 321 10*3/uL (ref 150–400)
RBC: 4.48 MIL/uL (ref 3.87–5.11)
RDW: 15 % (ref 11.5–15.5)
WBC: 10.9 10*3/uL — ABNORMAL HIGH (ref 4.0–10.5)
nRBC: 0 % (ref 0.0–0.2)

## 2021-10-18 LAB — HEPATIC FUNCTION PANEL
ALT: 22 U/L (ref 0–44)
AST: 17 U/L (ref 15–41)
Albumin: 3.7 g/dL (ref 3.5–5.0)
Alkaline Phosphatase: 54 U/L (ref 38–126)
Bilirubin, Direct: <0.1 mg/dL (ref 0.0–0.2)
Total Bilirubin: 0.5 mg/dL (ref 0.3–1.2)
Total Protein: 7 g/dL (ref 6.5–8.1)

## 2021-10-18 NOTE — Progress Notes (Signed)
Patient started having vaginal bleeding again on 08/25/21 and was evaluated by GYN Friday. ?

## 2021-10-18 NOTE — Assessment & Plan Note (Addendum)
#   Anemia-severe iron deficiency-NOV 2022- hemoglobin:8.4 ferritin-5; iron saturation:5%  Poor tolerance to p.o. iron/constipation. S/p Venofer- x4- Hb improved- 12.2; but no improvement of energy levels.  See below ? ?#Etiology:Most likely cause of patient's iron deficiency is her menorrhagia.  S/p recent evaluation with Dr.Beasley- IUD/ /TAH - ablation.  Patient may be considering ablation. ? ?# Fibromyalgias- recommend evaluation with Rheumatology.  ? ?# LFT -slight elevation January 2023; work-up negative; March 2023-within normal limits. ? ?# DISPOSITION: ?# no venofer today;  venofer this week [pt pref] ?# follow up in 4 months- MD; labs- cbcbmp-/iron studies/ferritin- possible venofer-Dr.B ?
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
## 2021-10-19 ENCOUNTER — Inpatient Hospital Stay: Payer: Medicaid Other

## 2021-10-25 ENCOUNTER — Ambulatory Visit
Admission: RE | Admit: 2021-10-25 | Discharge: 2021-10-25 | Disposition: A | Discharge status: Home / Self Care | Payer: Medicaid Other | Source: Ambulatory Visit | Attending: Family Medicine | Admitting: Family Medicine

## 2021-10-25 ENCOUNTER — Other Ambulatory Visit: Payer: Self-pay | Admitting: Family Medicine

## 2021-10-25 ENCOUNTER — Ambulatory Visit
Admission: RE | Admit: 2021-10-25 | Discharge: 2021-10-25 | Disposition: A | Discharge status: Home / Self Care | Payer: Medicaid Other | Attending: Family Medicine | Admitting: Family Medicine

## 2021-10-25 DIAGNOSIS — R52 Pain, unspecified: Secondary | ICD-10-CM

## 2021-11-24 ENCOUNTER — Other Ambulatory Visit: Payer: Self-pay | Admitting: Physician Assistant

## 2021-11-24 DIAGNOSIS — R519 Headache, unspecified: Secondary | ICD-10-CM

## 2021-12-02 ENCOUNTER — Ambulatory Visit: Admission: RE | Admit: 2021-12-02 | Payer: Medicaid Other | Source: Ambulatory Visit

## 2021-12-02 ENCOUNTER — Ambulatory Visit
Admission: RE | Admit: 2021-12-02 | Discharge: 2021-12-02 | Disposition: A | Discharge status: Home / Self Care | Payer: Medicaid Other | Source: Ambulatory Visit | Attending: Physician Assistant | Admitting: Physician Assistant

## 2021-12-02 DIAGNOSIS — R519 Headache, unspecified: Secondary | ICD-10-CM | POA: Insufficient documentation

## 2022-02-16 ENCOUNTER — Encounter: Payer: Self-pay | Admitting: Internal Medicine

## 2022-02-17 ENCOUNTER — Inpatient Hospital Stay: Payer: Medicaid Other

## 2022-02-17 ENCOUNTER — Inpatient Hospital Stay: Payer: Medicaid Other | Admitting: Internal Medicine

## 2022-02-17 MED FILL — Iron Sucrose Inj 20 MG/ML (Fe Equiv): INTRAVENOUS | Qty: 10 | Status: AC

## 2022-03-10 ENCOUNTER — Encounter: Payer: Self-pay | Admitting: Internal Medicine

## 2022-03-10 ENCOUNTER — Inpatient Hospital Stay: Payer: Medicaid Other | Attending: Internal Medicine

## 2022-03-10 ENCOUNTER — Inpatient Hospital Stay (HOSPITAL_BASED_OUTPATIENT_CLINIC_OR_DEPARTMENT_OTHER): Payer: Medicaid Other | Admitting: Internal Medicine

## 2022-03-10 ENCOUNTER — Inpatient Hospital Stay: Payer: Medicaid Other

## 2022-03-10 DIAGNOSIS — Z8249 Family history of ischemic heart disease and other diseases of the circulatory system: Secondary | ICD-10-CM | POA: Diagnosis not present

## 2022-03-10 DIAGNOSIS — Z8349 Family history of other endocrine, nutritional and metabolic diseases: Secondary | ICD-10-CM | POA: Diagnosis not present

## 2022-03-10 DIAGNOSIS — R5383 Other fatigue: Secondary | ICD-10-CM | POA: Insufficient documentation

## 2022-03-10 DIAGNOSIS — Z8261 Family history of arthritis: Secondary | ICD-10-CM | POA: Diagnosis not present

## 2022-03-10 DIAGNOSIS — D649 Anemia, unspecified: Secondary | ICD-10-CM

## 2022-03-10 DIAGNOSIS — Z91030 Bee allergy status: Secondary | ICD-10-CM | POA: Insufficient documentation

## 2022-03-10 DIAGNOSIS — M2550 Pain in unspecified joint: Secondary | ICD-10-CM | POA: Insufficient documentation

## 2022-03-10 DIAGNOSIS — D500 Iron deficiency anemia secondary to blood loss (chronic): Secondary | ICD-10-CM | POA: Diagnosis present

## 2022-03-10 DIAGNOSIS — N920 Excessive and frequent menstruation with regular cycle: Secondary | ICD-10-CM | POA: Diagnosis not present

## 2022-03-10 DIAGNOSIS — Z8379 Family history of other diseases of the digestive system: Secondary | ICD-10-CM | POA: Diagnosis not present

## 2022-03-10 DIAGNOSIS — M791 Myalgia, unspecified site: Secondary | ICD-10-CM | POA: Diagnosis not present

## 2022-03-10 DIAGNOSIS — Z79899 Other long term (current) drug therapy: Secondary | ICD-10-CM | POA: Insufficient documentation

## 2022-03-10 DIAGNOSIS — Z880 Allergy status to penicillin: Secondary | ICD-10-CM | POA: Insufficient documentation

## 2022-03-10 DIAGNOSIS — M542 Cervicalgia: Secondary | ICD-10-CM | POA: Insufficient documentation

## 2022-03-10 LAB — BASIC METABOLIC PANEL
Anion gap: 8 (ref 5–15)
BUN: 11 mg/dL (ref 6–20)
CO2: 23 mmol/L (ref 22–32)
Calcium: 8.9 mg/dL (ref 8.9–10.3)
Chloride: 105 mmol/L (ref 98–111)
Creatinine, Ser: 1.01 mg/dL — ABNORMAL HIGH (ref 0.44–1.00)
GFR, Estimated: >60 mL/min (ref >60)
Glucose, Bld: 89 mg/dL (ref 70–99)
Potassium: 3.6 mmol/L (ref 3.5–5.1)
Sodium: 136 mmol/L (ref 135–145)

## 2022-03-10 LAB — CBC WITH DIFFERENTIAL/PLATELET
Abs Immature Granulocytes: 0.05 10*3/uL (ref 0.00–0.07)
Basophils Absolute: 0.1 10*3/uL (ref 0.0–0.1)
Basophils Relative: 1 %
Eosinophils Absolute: 0.2 10*3/uL (ref 0.0–0.5)
Eosinophils Relative: 2 %
HCT: 39.2 % (ref 36.0–46.0)
Hemoglobin: 13.4 g/dL (ref 12.0–15.0)
Immature Granulocytes: 1 %
Lymphocytes Relative: 32 %
Lymphs Abs: 3.4 10*3/uL (ref 0.7–4.0)
MCH: 30 pg (ref 26.0–34.0)
MCHC: 34.2 g/dL (ref 30.0–36.0)
MCV: 87.9 fL (ref 80.0–100.0)
Monocytes Absolute: 0.7 10*3/uL (ref 0.1–1.0)
Monocytes Relative: 6 %
Neutro Abs: 6.4 10*3/uL (ref 1.7–7.7)
Neutrophils Relative %: 58 %
Platelets: 340 10*3/uL (ref 150–400)
RBC: 4.46 MIL/uL (ref 3.87–5.11)
RDW: 13.1 % (ref 11.5–15.5)
WBC: 10.8 10*3/uL — ABNORMAL HIGH (ref 4.0–10.5)
nRBC: 0 % (ref 0.0–0.2)

## 2022-03-10 LAB — IRON AND TIBC
Iron: 89 ug/dL (ref 28–170)
Saturation Ratios: 26 % (ref 10.4–31.8)
TIBC: 344 ug/dL (ref 250–450)
UIBC: 255 ug/dL

## 2022-03-10 LAB — FERRITIN: Ferritin: 21 ng/mL (ref 11–307)

## 2022-03-10 MED ORDER — SODIUM CHLORIDE 0.9 % IV SOLN
Freq: Once | INTRAVENOUS | Status: DC
  Filled 2022-03-10: qty 250

## 2022-03-10 MED ORDER — SODIUM CHLORIDE 0.9 % IV SOLN
200.0000 mg | Freq: Once | INTRAVENOUS | Status: DC
  Filled 2022-03-10: qty 10
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
## 2022-03-10 NOTE — Assessment & Plan Note (Addendum)
#   Anemia-severe iron deficiency-NOV 2022- hemoglobin:8.4 ferritin-5; iron saturation:5%  Poor tolerance to p.o. iron/constipation. S/p Venofer.  Hemoglobin today improved at 13.  However ongoing menstruation menorrhagia for more than a month.  Proceed with Venofer today.  #Etiology:Most likely cause of patient's iron deficiency is her menorrhagia.  S/p recent evaluation with Dr.Beasley- IUD/ /TAH - ablation.  Patient may be considering ablation.  # BP- 140-150- recommend checking at home; take a log to PCP.   # LFT -slight elevation January 2023; work-up negative; March 2023-within normal limits.  # DISPOSITION: # proceed with  venofer today;   # follow up in 6 months- MD; labs- cbc/cmp-/iron studies/ferritin- possible venofer-Dr.B

## 2022-03-10 NOTE — Progress Notes (Signed)
Pt states she is back bleeding: started on June 26th and has not stopped yet.

## 2022-03-10 NOTE — Progress Notes (Signed)
Unable to obtain IV access at todays visit, pt to hydrate and reschedule.

## 2022-03-17 ENCOUNTER — Inpatient Hospital Stay: Payer: Medicaid Other

## 2022-03-17 VITALS — BP 143/92 | HR 81 | Temp 99.1°F | Resp 20

## 2022-03-17 DIAGNOSIS — D649 Anemia, unspecified: Secondary | ICD-10-CM

## 2022-03-17 DIAGNOSIS — D500 Iron deficiency anemia secondary to blood loss (chronic): Secondary | ICD-10-CM | POA: Diagnosis not present

## 2022-03-17 MED ORDER — SODIUM CHLORIDE 0.9 % IV SOLN
200.0000 mg | Freq: Once | INTRAVENOUS | Status: AC
  Administered 2022-03-17: 200 mg via INTRAVENOUS
  Filled 2022-03-17: qty 200

## 2022-03-17 MED ORDER — SODIUM CHLORIDE 0.9 % IV SOLN
Freq: Once | INTRAVENOUS | Status: AC
  Filled 2022-03-17: qty 250

## 2022-05-12 ENCOUNTER — Other Ambulatory Visit: Payer: Self-pay | Admitting: Family Medicine

## 2022-05-12 ENCOUNTER — Ambulatory Visit
Admission: RE | Admit: 2022-05-12 | Discharge: 2022-05-12 | Disposition: A | Discharge status: Home / Self Care | Payer: Medicaid Other | Source: Ambulatory Visit | Attending: Family Medicine | Admitting: Family Medicine

## 2022-05-12 ENCOUNTER — Ambulatory Visit
Admission: RE | Admit: 2022-05-12 | Discharge: 2022-05-12 | Disposition: A | Discharge status: Home / Self Care | Payer: Medicaid Other | Attending: Family Medicine | Admitting: Family Medicine

## 2022-05-12 DIAGNOSIS — M79645 Pain in left finger(s): Secondary | ICD-10-CM

## 2022-09-07 MED FILL — Iron Sucrose Inj 20 MG/ML (Fe Equiv): INTRAVENOUS | Qty: 10 | Status: AC

## 2022-09-08 ENCOUNTER — Inpatient Hospital Stay: Payer: Medicaid Other

## 2022-09-08 ENCOUNTER — Inpatient Hospital Stay: Payer: Medicaid Other | Admitting: Internal Medicine

## 2022-12-27 ENCOUNTER — Other Ambulatory Visit: Payer: Self-pay | Admitting: Physical Medicine & Rehabilitation

## 2022-12-27 DIAGNOSIS — M5442 Lumbago with sciatica, left side: Secondary | ICD-10-CM

## 2023-01-10 ENCOUNTER — Ambulatory Visit
Admission: RE | Admit: 2023-01-10 | Discharge: 2023-01-10 | Disposition: A | Discharge status: Home / Self Care | Payer: Medicaid Other | Source: Ambulatory Visit | Attending: Physical Medicine & Rehabilitation | Admitting: Physical Medicine & Rehabilitation

## 2023-01-10 DIAGNOSIS — M5441 Lumbago with sciatica, right side: Secondary | ICD-10-CM

## 2023-05-30 IMAGING — US US PELVIS COMPLETE TRANSABD/TRANSVAG W DUPLEX
1 series · 13 of 25 positions shown · non-contrast
Comparison: 09/05/2019

CLINICAL DATA: Vaginal bleeding

EXAM:
TRANSABDOMINAL AND TRANSVAGINAL ULTRASOUND OF PELVIS
DOPPLER ULTRASOUND OF OVARIES
TECHNIQUE: Both transabdominal and transvaginal ultrasound examinations of the
pelvis were performed. Transabdominal technique was performed for
global imaging of the pelvis including uterus, ovaries, adnexal
regions, and pelvic cul-de-sac.
It was necessary to proceed with endovaginal exam following the
transabdominal exam to visualize the endometrium. Color and duplex
Doppler ultrasound was utilized to evaluate blood flow to the
ovaries.

[Series 1: us pelvic complete w transvaginal and torsion righ · 13 of 93 slices shown]
[im 1/93]
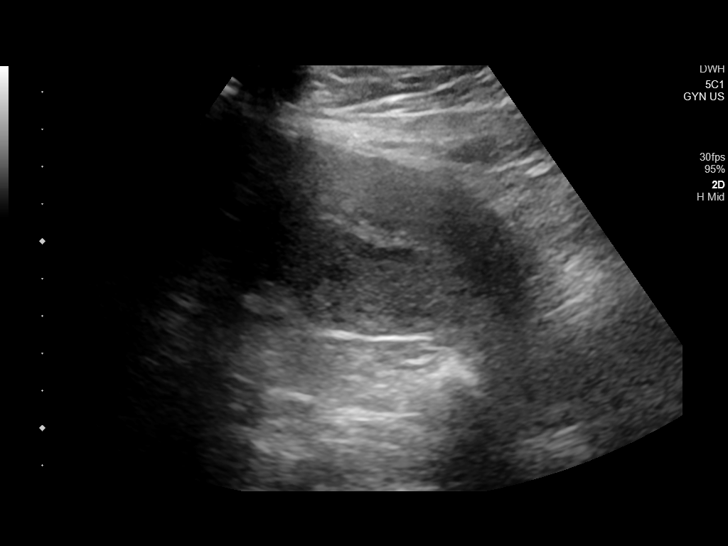
[im 8/93]
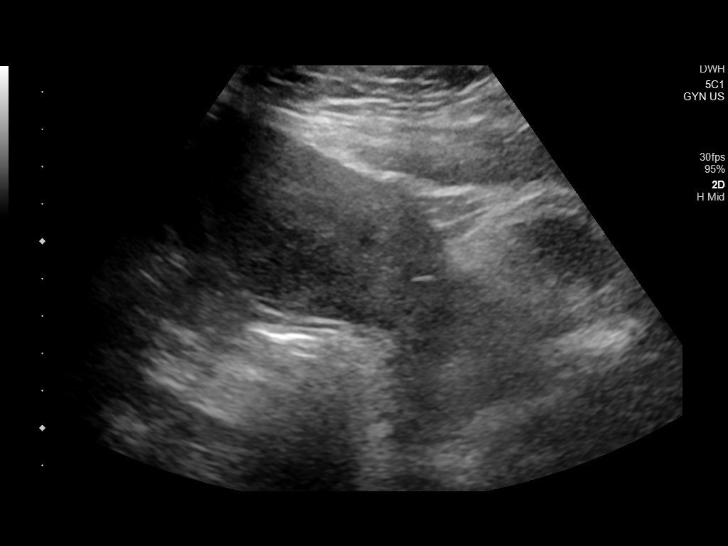
[im 16/93]
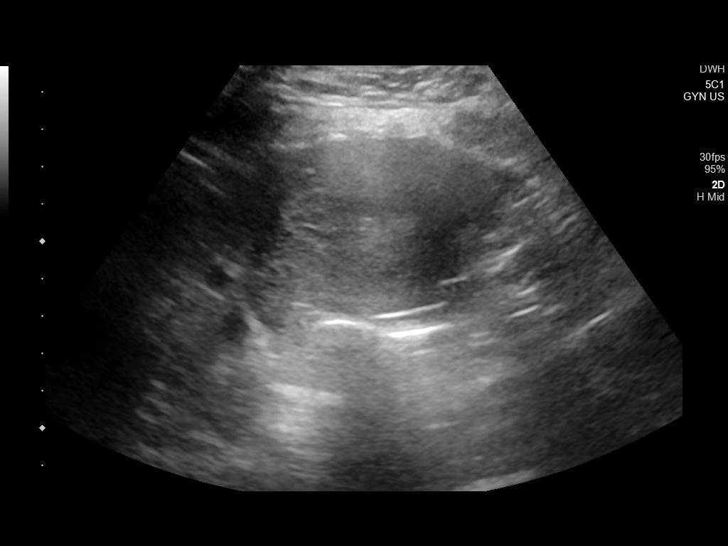
[im 24/93]
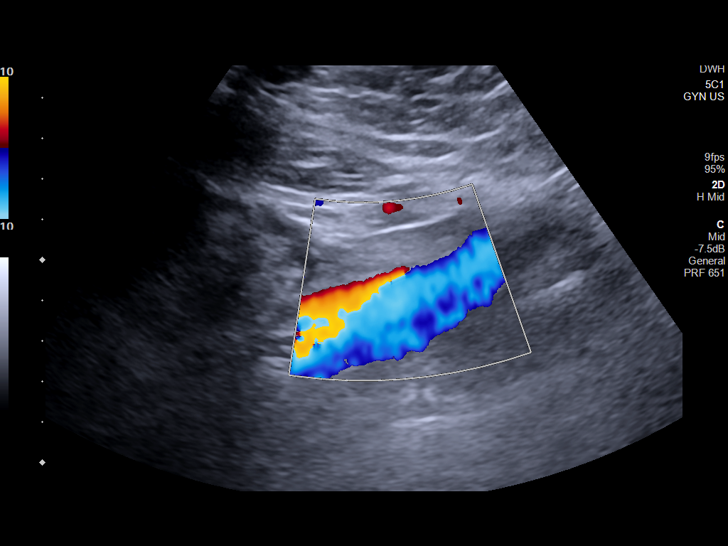
[im 31/93]
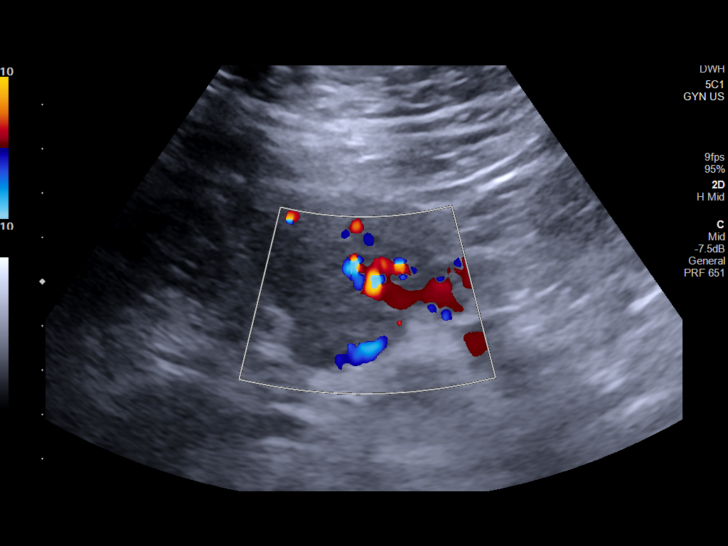
[im 39/93]
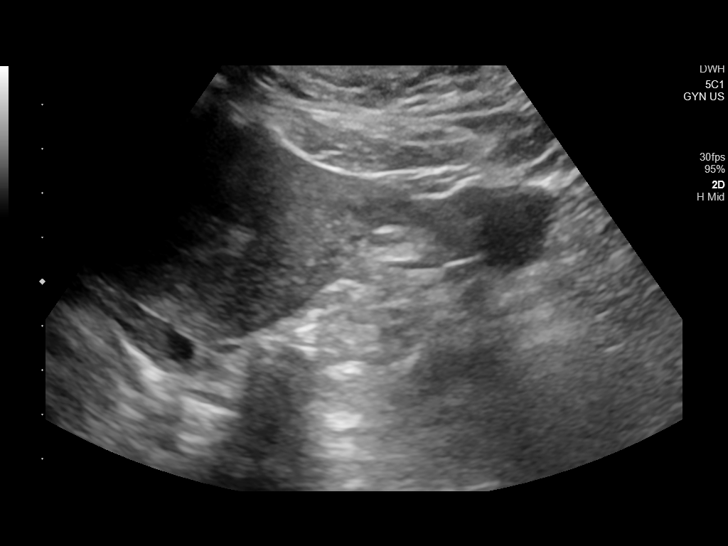
[im 47/93]
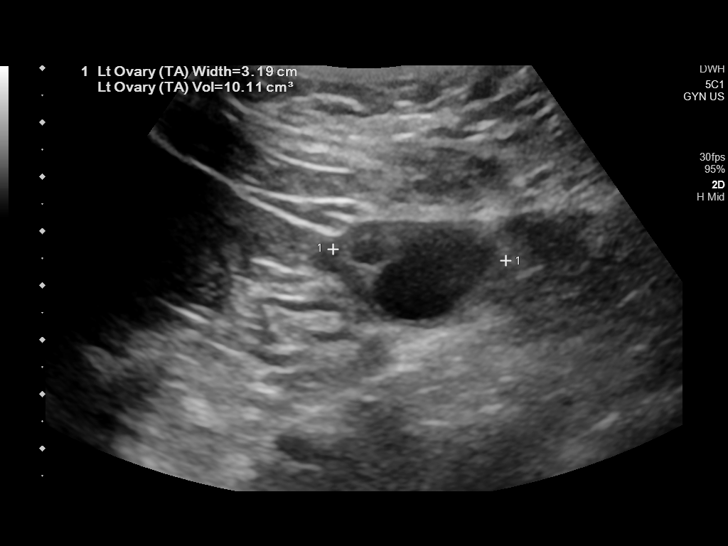
[im 54/93]
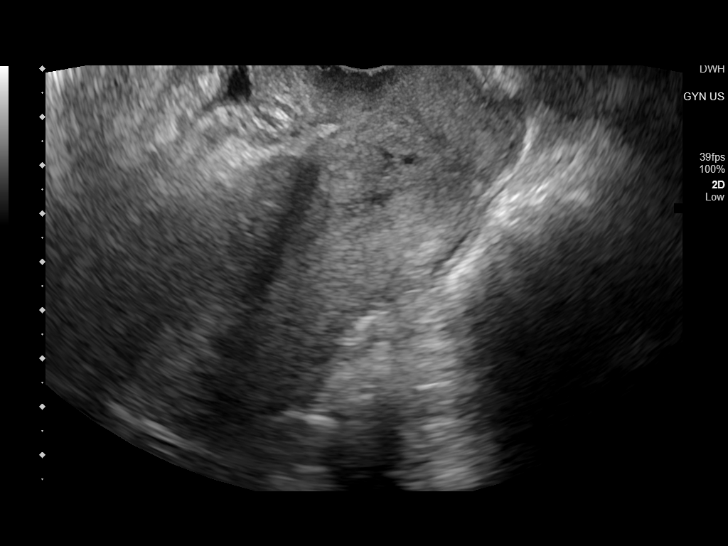
[im 62/93]
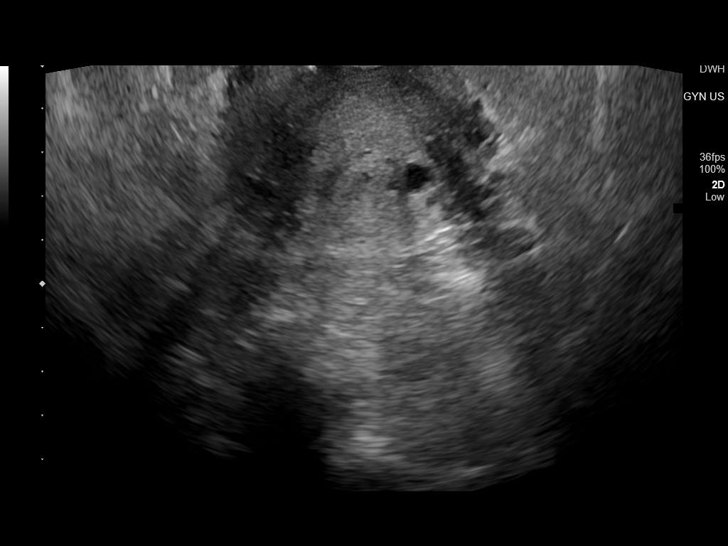
[im 70/93]
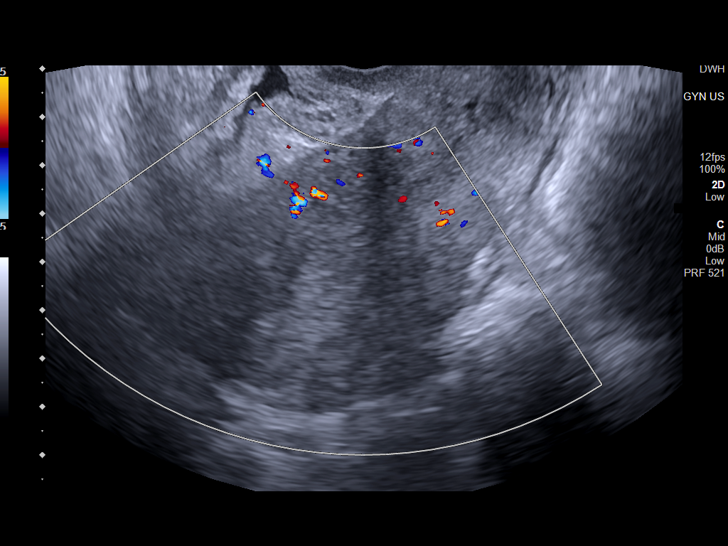
[im 77/93]
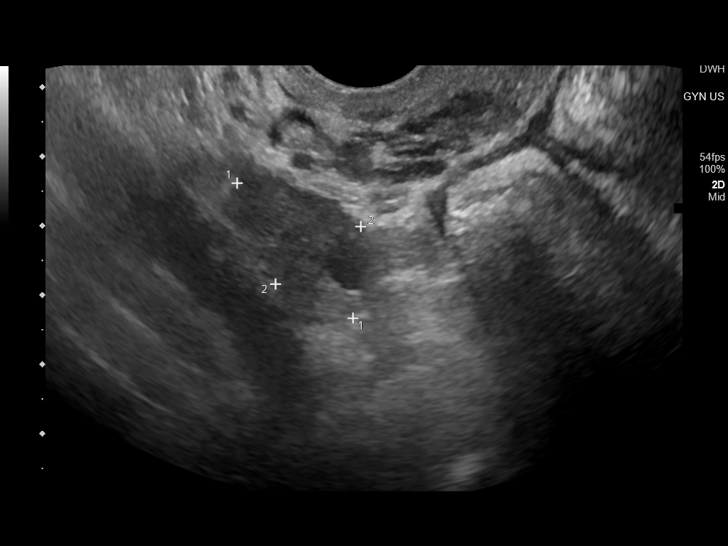
[im 85/93]
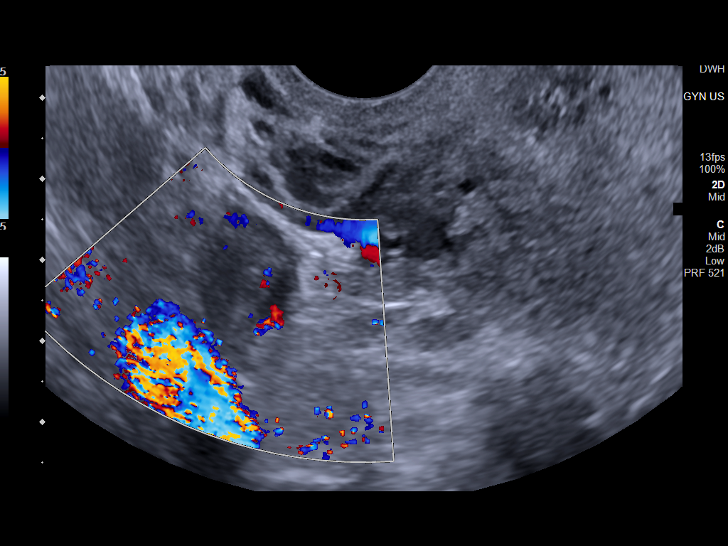
[im 93/93]
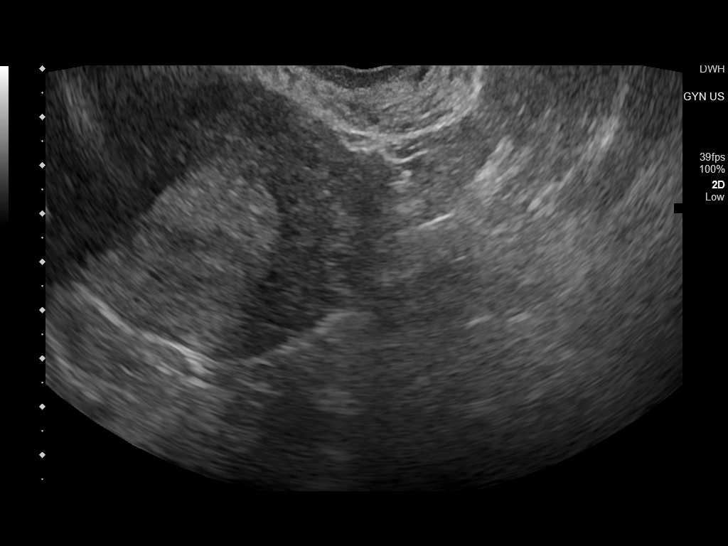

[13 of 25 positions shown; findings below may reference images not displayed]

FINDINGS: Uterus

Measurements: 10 x 5 x 5 cm = volume: 140 mL. No fibroids or other
mass visualized.

Endometrium

Thickness: 12 mm.  No focal abnormality visualized.

Right ovary

Measurements: 26 x 15 x 13 mm = volume: 2.6 mL. Normal appearance/no
adnexal mass.

Left ovary

Measurements: 31 x 19 x 32 mm = volume: 10 mL. Normal appearance/no
adnexal mass.

Pulsed Doppler evaluation of both ovaries demonstrates normal
low-resistance arterial and venous waveforms.

Other findings

No abnormal free fluid.
IMPRESSION: 1. No acute finding.
2. 12 mm endometrial stripe.  No focal endometrial finding.
3. No visible fibroid.

## 2024-09-10 ENCOUNTER — Ambulatory Visit
